# Patient Record
Sex: Female | Born: 1970
Health system: Southern US, Community
[De-identification: ages and names within clinical notes are randomized; demographics above are authoritative.]

## PROBLEM LIST (undated history)

## (undated) DIAGNOSIS — R599 Enlarged lymph nodes, unspecified: Secondary | ICD-10-CM

## (undated) DIAGNOSIS — R112 Nausea with vomiting, unspecified: Secondary | ICD-10-CM

## (undated) DIAGNOSIS — F329 Major depressive disorder, single episode, unspecified: Secondary | ICD-10-CM

## (undated) DIAGNOSIS — F32A Depression, unspecified: Secondary | ICD-10-CM

## (undated) DIAGNOSIS — F411 Generalized anxiety disorder: Secondary | ICD-10-CM

## (undated) DIAGNOSIS — R232 Flushing: Secondary | ICD-10-CM

## (undated) DIAGNOSIS — Z975 Presence of (intrauterine) contraceptive device: Secondary | ICD-10-CM

## (undated) DIAGNOSIS — R4589 Other symptoms and signs involving emotional state: Secondary | ICD-10-CM

## (undated) DIAGNOSIS — Z9889 Other specified postprocedural states: Secondary | ICD-10-CM

## (undated) HISTORY — DX: Other symptoms and signs involving emotional state: R45.89

## (undated) HISTORY — DX: Major depressive disorder, single episode, unspecified: F32.9

## (undated) HISTORY — DX: Enlarged lymph nodes, unspecified: R59.9

## (undated) HISTORY — DX: Depression, unspecified: F32.A

## (undated) HISTORY — DX: Flushing: R23.2

## (undated) HISTORY — PX: ABDOMINOPLASTY: SUR9

## (undated) HISTORY — DX: Generalized anxiety disorder: F41.1

## (undated) HISTORY — DX: Presence of (intrauterine) contraceptive device: Z97.5

---

## 1998-06-12 ENCOUNTER — Emergency Department (HOSPITAL_COMMUNITY): Admission: EM | Admit: 1998-06-12 | Discharge: 1998-06-12 | Payer: Self-pay | Admitting: Emergency Medicine

## 1998-06-18 ENCOUNTER — Ambulatory Visit (HOSPITAL_COMMUNITY): Admission: RE | Admit: 1998-06-18 | Discharge: 1998-06-18 | Payer: Self-pay | Admitting: Family Medicine

## 1998-06-18 ENCOUNTER — Encounter: Payer: Self-pay | Admitting: Family Medicine

## 1998-08-15 ENCOUNTER — Ambulatory Visit (HOSPITAL_COMMUNITY): Admission: RE | Admit: 1998-08-15 | Discharge: 1998-08-15 | Payer: Self-pay | Admitting: Family Medicine

## 1998-08-15 ENCOUNTER — Encounter: Payer: Self-pay | Admitting: Family Medicine

## 1998-11-19 ENCOUNTER — Other Ambulatory Visit: Admission: RE | Admit: 1998-11-19 | Discharge: 1998-11-19 | Payer: Self-pay | Admitting: Obstetrics and Gynecology

## 1999-06-13 ENCOUNTER — Inpatient Hospital Stay (HOSPITAL_COMMUNITY): Admission: AD | Admit: 1999-06-13 | Discharge: 1999-06-14 | Payer: Self-pay | Admitting: *Deleted

## 1999-06-30 ENCOUNTER — Inpatient Hospital Stay (HOSPITAL_COMMUNITY): Admission: AD | Admit: 1999-06-30 | Discharge: 1999-06-30 | Payer: Self-pay | Admitting: Obstetrics and Gynecology

## 1999-07-02 ENCOUNTER — Inpatient Hospital Stay (HOSPITAL_COMMUNITY): Admission: AD | Admit: 1999-07-02 | Discharge: 1999-07-05 | Payer: Self-pay | Admitting: *Deleted

## 1999-07-16 ENCOUNTER — Encounter: Admission: RE | Admit: 1999-07-16 | Discharge: 1999-10-14 | Payer: Self-pay | Admitting: *Deleted

## 2003-03-20 ENCOUNTER — Other Ambulatory Visit: Admission: RE | Admit: 2003-03-20 | Discharge: 2003-03-20 | Payer: Self-pay | Admitting: Internal Medicine

## 2003-09-15 HISTORY — PX: ABDOMINOPLASTY: SUR9

## 2005-03-30 ENCOUNTER — Other Ambulatory Visit: Admission: RE | Admit: 2005-03-30 | Discharge: 2005-03-30 | Payer: Self-pay | Admitting: Family Medicine

## 2006-08-31 ENCOUNTER — Other Ambulatory Visit: Admission: RE | Admit: 2006-08-31 | Discharge: 2006-08-31 | Payer: Self-pay | Admitting: Family Medicine

## 2007-04-25 ENCOUNTER — Emergency Department (HOSPITAL_COMMUNITY): Admission: EM | Admit: 2007-04-25 | Discharge: 2007-04-25 | Payer: Self-pay | Admitting: Emergency Medicine

## 2007-04-28 ENCOUNTER — Emergency Department (HOSPITAL_COMMUNITY): Admission: EM | Admit: 2007-04-28 | Discharge: 2007-04-28 | Payer: Self-pay | Admitting: Emergency Medicine

## 2007-05-01 ENCOUNTER — Emergency Department (HOSPITAL_COMMUNITY): Admission: EM | Admit: 2007-05-01 | Discharge: 2007-05-01 | Payer: Self-pay | Admitting: Emergency Medicine

## 2010-10-22 ENCOUNTER — Other Ambulatory Visit: Payer: Self-pay | Admitting: Obstetrics and Gynecology

## 2010-10-22 DIAGNOSIS — Z1231 Encounter for screening mammogram for malignant neoplasm of breast: Secondary | ICD-10-CM

## 2010-10-22 DIAGNOSIS — Z803 Family history of malignant neoplasm of breast: Secondary | ICD-10-CM

## 2010-10-29 ENCOUNTER — Ambulatory Visit
Admission: RE | Admit: 2010-10-29 | Discharge: 2010-10-29 | Disposition: A | Discharge status: Home / Self Care | Payer: Commercial Managed Care - PPO | Source: Ambulatory Visit | Attending: Obstetrics and Gynecology | Admitting: Obstetrics and Gynecology

## 2010-10-29 DIAGNOSIS — Z1231 Encounter for screening mammogram for malignant neoplasm of breast: Secondary | ICD-10-CM

## 2010-10-29 DIAGNOSIS — Z803 Family history of malignant neoplasm of breast: Secondary | ICD-10-CM

## 2010-11-04 ENCOUNTER — Other Ambulatory Visit: Payer: Self-pay | Admitting: Obstetrics and Gynecology

## 2010-11-04 DIAGNOSIS — R928 Other abnormal and inconclusive findings on diagnostic imaging of breast: Secondary | ICD-10-CM

## 2010-11-12 ENCOUNTER — Ambulatory Visit
Admission: RE | Admit: 2010-11-12 | Discharge: 2010-11-12 | Disposition: A | Discharge status: Home / Self Care | Payer: Commercial Managed Care - PPO | Source: Ambulatory Visit | Attending: Obstetrics and Gynecology | Admitting: Obstetrics and Gynecology

## 2010-11-12 DIAGNOSIS — R928 Other abnormal and inconclusive findings on diagnostic imaging of breast: Secondary | ICD-10-CM

## 2011-06-26 LAB — BASIC METABOLIC PANEL
BUN: 12
Calcium: 9.4
Creatinine, Ser: 0.64
GFR calc Af Amer: >60
GFR calc non Af Amer: >60

## 2011-06-26 LAB — CBC
Platelets: 253
RBC: 3.79 — ABNORMAL LOW
WBC: 8.6

## 2011-06-26 LAB — D-DIMER, QUANTITATIVE: D-Dimer, Quant: 0.37

## 2011-06-29 LAB — COMPREHENSIVE METABOLIC PANEL
ALT: 17
Alkaline Phosphatase: 58
Chloride: 106
Glucose, Bld: 105 — ABNORMAL HIGH
Potassium: 3.7
Sodium: 136
Total Bilirubin: 1
Total Protein: 7.3

## 2011-06-29 LAB — URINALYSIS, ROUTINE W REFLEX MICROSCOPIC
Bilirubin Urine: NEGATIVE
Glucose, UA: NEGATIVE
Ketones, ur: NEGATIVE
Nitrite: NEGATIVE
pH: 6.5

## 2011-06-29 LAB — CBC
Hemoglobin: 12.6
RBC: 4.03
RDW: 11.8
WBC: 6.5

## 2011-06-29 LAB — POCT I-STAT CREATININE
Creatinine, Ser: 0.7
Operator id: 282201

## 2011-06-29 LAB — POCT CARDIAC MARKERS
CKMB, poc: <1 — ABNORMAL LOW
Myoglobin, poc: 30.3

## 2011-06-29 LAB — I-STAT 8, (EC8 V) (CONVERTED LAB)
Acid-base deficit: 2
TCO2: 24
pCO2, Ven: 37.5 — ABNORMAL LOW
pH, Ven: 7.391 — ABNORMAL HIGH

## 2011-06-29 LAB — MONONUCLEOSIS SCREEN: Mono Screen: NEGATIVE

## 2011-11-18 ENCOUNTER — Other Ambulatory Visit: Payer: Self-pay | Admitting: Obstetrics and Gynecology

## 2011-11-18 DIAGNOSIS — N6009 Solitary cyst of unspecified breast: Secondary | ICD-10-CM

## 2011-11-25 ENCOUNTER — Ambulatory Visit
Admission: RE | Admit: 2011-11-25 | Discharge: 2011-11-25 | Disposition: A | Discharge status: Home / Self Care | Payer: 59 | Source: Ambulatory Visit | Attending: Obstetrics and Gynecology | Admitting: Obstetrics and Gynecology

## 2011-11-25 DIAGNOSIS — N6009 Solitary cyst of unspecified breast: Secondary | ICD-10-CM

## 2013-03-14 ENCOUNTER — Encounter: Payer: Self-pay | Admitting: Family Medicine

## 2013-03-14 ENCOUNTER — Ambulatory Visit (INDEPENDENT_AMBULATORY_CARE_PROVIDER_SITE_OTHER): Payer: BC Managed Care – PPO | Admitting: Family Medicine

## 2013-03-14 VITALS — BP 102/58 | HR 76 | Temp 98.7°F | Ht 61.0 in | Wt 159.0 lb

## 2013-03-14 DIAGNOSIS — T148 Other injury of unspecified body region: Secondary | ICD-10-CM

## 2013-03-14 DIAGNOSIS — W57XXXA Bitten or stung by nonvenomous insect and other nonvenomous arthropods, initial encounter: Secondary | ICD-10-CM

## 2013-03-14 MED ORDER — DOXYCYCLINE HYCLATE 100 MG PO CAPS: 100.0000 mg | ORAL_CAPSULE | Freq: Two times a day (BID) | ORAL | Status: DC

## 2013-03-14 NOTE — Progress Notes (Signed)
  Subjective:    Patient ID: Meghan Murray, female    DOB: 1971/08/28, 42 y.o.   MRN: [id number]  HPI Patient presents today with multiple tick bites. Patient states that she's had multiple tick bites over the past 2 months that she has had to remove.  Visit home with multiple dull to cats. Patient states she's had about 1-1-1/2 months of generalized malaise as well as fatigue body aches and chills. Patient reports that her dog was recently diagnosed with Lyme disease and is currently receiving treatment for this. Patient would like to be evaluated for symptoms.    Review of Systems  All other systems reviewed and are negative.       Objective:   Physical Exam  Constitutional: She appears well-developed and well-nourished.  HENT:  Head: Normocephalic and atraumatic.  Right Ear: External ear normal.  Left Ear: External ear normal.  Eyes: Conjunctivae are normal. Pupils are equal, round, and reactive to light.  Neck: Normal range of motion.  Cardiovascular: Normal rate, regular rhythm and normal heart sounds.   Pulmonary/Chest: Effort normal and breath sounds normal.  Abdominal: Soft.  Musculoskeletal: Normal range of motion.  Skin: No rash noted.          Assessment & Plan:  Tick bites - Plan: CBC with Differential, B. burgdorfi antibodies, Rocky mtn spotted fvr abs pnl(IgG+IgM)  Will place patient on extended course of doxycycline pending blood workup. Discussed general, neuro, infectious red flags. Followup as needed.

## 2013-03-15 LAB — CBC WITH DIFFERENTIAL/PLATELET
Basophils Absolute: 0 10*3/uL (ref 0.0–0.1)
Basophils Relative: 0 % (ref 0–1)
Eosinophils Relative: 1 % (ref 0–5)
HCT: 35.5 % — ABNORMAL LOW (ref 36.0–46.0)
MCHC: 34.4 g/dL (ref 30.0–36.0)
MCV: 89.6 fL (ref 78.0–100.0)
Monocytes Absolute: 0.3 10*3/uL (ref 0.1–1.0)
RDW: 12.9 % (ref 11.5–15.5)

## 2013-03-15 LAB — ROCKY MTN SPOTTED FVR ABS PNL(IGG+IGM): RMSF IgM: 0.47 IV

## 2013-03-21 ENCOUNTER — Ambulatory Visit (INDEPENDENT_AMBULATORY_CARE_PROVIDER_SITE_OTHER): Payer: BC Managed Care – PPO | Admitting: Women's Health

## 2013-03-21 ENCOUNTER — Encounter: Payer: Self-pay | Admitting: Women's Health

## 2013-03-21 ENCOUNTER — Other Ambulatory Visit (HOSPITAL_COMMUNITY)
Admission: RE | Admit: 2013-03-21 | Discharge: 2013-03-21 | Disposition: A | Discharge status: Home / Self Care | Payer: BC Managed Care – PPO | Source: Ambulatory Visit | Attending: Obstetrics & Gynecology | Admitting: Obstetrics & Gynecology

## 2013-03-21 VITALS — BP 118/82 | Ht 62.0 in | Wt 157.8 lb

## 2013-03-21 DIAGNOSIS — Z01419 Encounter for gynecological examination (general) (routine) without abnormal findings: Secondary | ICD-10-CM | POA: Insufficient documentation

## 2013-03-21 DIAGNOSIS — F418 Other specified anxiety disorders: Secondary | ICD-10-CM

## 2013-03-21 DIAGNOSIS — R635 Abnormal weight gain: Secondary | ICD-10-CM

## 2013-03-21 DIAGNOSIS — Z1151 Encounter for screening for human papillomavirus (HPV): Secondary | ICD-10-CM | POA: Insufficient documentation

## 2013-03-21 DIAGNOSIS — Z1212 Encounter for screening for malignant neoplasm of rectum: Secondary | ICD-10-CM

## 2013-03-21 LAB — CBC
MCHC: 34.3 g/dL (ref 30.0–36.0)
MCV: 89.8 fL (ref 78.0–100.0)
Platelets: 293 10*3/uL (ref 150–400)
RDW: 13 % (ref 11.5–15.5)
WBC: 5.2 10*3/uL (ref 4.0–10.5)

## 2013-03-21 LAB — COMPREHENSIVE METABOLIC PANEL
ALT: 18 U/L (ref 0–35)
AST: 20 U/L (ref 0–37)
Alkaline Phosphatase: 59 U/L (ref 39–117)
Calcium: 9.6 mg/dL (ref 8.4–10.5)
Chloride: 105 mEq/L (ref 96–112)
Creat: 0.74 mg/dL (ref 0.50–1.10)
Total Bilirubin: 0.6 mg/dL (ref 0.3–1.2)

## 2013-03-21 LAB — HEMOCCULT GUIAC POC 1CARD (OFFICE)

## 2013-03-21 LAB — LIPID PANEL
HDL: 77 mg/dL (ref >39)
LDL Cholesterol: 98 mg/dL (ref 0–99)
Total CHOL/HDL Ratio: 2.4 Ratio
VLDL: 12 mg/dL (ref 0–40)

## 2013-03-21 MED ORDER — VENLAFAXINE HCL ER 75 MG PO CP24: 75.0000 mg | ORAL_CAPSULE | Freq: Every day | ORAL | Status: DC

## 2013-03-21 NOTE — Progress Notes (Signed)
Subjective:     Meghan Murray is a 42 y.o. caucasian female here for a pap smear and physical.  No LMP recorded. Patient is not currently having periods (Reason: IUD).  Current complaints: reports h/o depression and anxiety- currently taking celexa 20mg  daily. States after graduating w/ her master's in dec 2013, her depression has steadily gotten worse.  She is fatigued, used to get up early and run, now she looks forward to sleeping and doesn't want to get up.  Does not find joy in things she used to, tears up as she talks about it. Young daughter is pregnant and moved back in w/ her and she is concerned about having to take on financial responsibility of a new baby in addition to paying student loans. 30+lb weight gain in last 6 months. Feels like she is just not herself. Denies suicidal/homicidal ideations. Also reports daily headaches, worse in AM, goes into ears/neck/shoulders- waxes and wanes throughout the day. Occasional night sweats. Denies vaginal dryness, problems w/ urination or bowel movements.  Had office visit w/ PCP on 7/1 d/t fatigue, tick bites, dog w/ Lyme disease, labs were drawn and normal, was placed on 3wk doxycycline regimen prophylactically.  Has Mirena IUD that was placed May 2009, needs to come out. Would like another Mirena.   Gynecologic History No LMP recorded. Patient is not currently having periods (Reason: IUD). Contraception: IUD Last mammogram: 11/25/11. Results were: Bi-rads 2, no evidence of malignancy. U/S 11/25/11: clustered nonpalpable cyts 8 o'clock 2cm from Rt nipple, no evidence of malignancy. Recommended yearly mammograms. Both her mother and MGM had breast CA dx in 36s.   The following portions of the patient's history were reviewed and updated as appropriate: allergies, current medications, past family history, past medical history, past social history, past surgical history and problem list.  Review of Systems  Review of Systems  Constitutional:  Negative for fever, chills, weight Murray, and diaphoresis. Pos for malaise/fatigue HENT: Negative for hearing Murray, ear pain, nosebleeds, congestion, sore throat, neck pain, tinnitus and ear discharge.   Eyes: Negative for blurred vision, double vision, photophobia, pain, discharge and redness.  Respiratory: Negative for cough, hemoptysis, sputum production, shortness of breath, wheezing and stridor.   Cardiovascular: Negative for chest pain, palpitations, orthopnea, claudication, leg swelling and PND.  Gastrointestinal: negative for abdominal pain. Negative for heartburn, nausea, vomiting, diarrhea, constipation, blood in stool and melena.  Genitourinary: Negative for dysuria, urgency, frequency, hematuria and flank pain.  Musculoskeletal: Negative for myalgias, back pain, joint pain and falls.  Skin: Negative for itching and rash.  Neurological: Negative for dizziness, tingling, tremors, sensory change, speech change, focal weakness, seizures, Murray of consciousness, weakness. Pos for headaches. Endo/Heme/Allergies: Negative for environmental allergies and polydipsia. Does not bruise/bleed easily.  Psychiatric/Behavioral: Negative for suicidal ideas, hallucinations, memory Murray and substance abuse. The patient is not nervous/anxious and does not have insomnia.  Pos for depression.       Objective:    Physical Exam  Vitals reviewed. Constitutional: She is oriented to person, place, and time. She appears well-developed and well-nourished.  HENT:       Head: Normocephalic and atraumatic.             Right Ear: External ear normal.       Left Ear: External ear normal.       Nose: Nose normal.       Mouth/Throat: Oropharynx is clear and moist.       Eyes: Conjunctivae and EOM are normal. Pupils  are equal, round, and reactive to light. Right eye exhibits no discharge. Left eye exhibits no discharge. No scleral icterus.  Neck: Normal range of motion. Neck supple. No tracheal deviation present. No  thyromegaly present.  Cardiovascular: Normal rate, regular rhythm, normal heart sounds and intact distal pulses.  Exam reveals no gallop and no friction rub.   No murmur heard. Respiratory: Effort normal and breath sounds normal. No respiratory distress. She has no wheezes. She has no rales. She exhibits no tenderness.  GI: Soft. Bowel sounds are normal. She exhibits no distension and no mass. There is no tenderness. There is no rebound and no guarding.       Hemoccult neg Genitourinary:       Vulva is normal without lesions      Vagina is pink moist without discharge      Cervix normal in appearance, very anterior, mirena strings visible, and pap is done      Uterus is normal size shape and contour      Adnexa is negative with normal sized ovaries  Musculoskeletal: Normal range of motion. She exhibits no edema and no tenderness.  Neurological: She is alert and oriented to person, place, and time. She has normal reflexes. She displays normal reflexes. No cranial nerve deficit. She exhibits normal muscle tone. Coordination normal.  Skin: Skin is warm and dry. No rash noted. No erythema. No pallor.  Psychiatric: She has a normal mood and affect. Her behavior is normal. Judgment and thought content normal. She does tear up when talking about depression.      Assessment:    Healthy female exam.  Depression/anxiety- not controlled on current regimen Contraception counseling Strong familial h/o Breast CA   Plan:  Stop taking Celexa Rx: Effexor XL 75mg  daily #30 w/ 3RF Massage/chiropractor, OTC meds prn for headaches CBC, CMP, lipid profile, TSH today Mammogram ASAP F/U 4wks for mirena removal and new insertion and f/u depression/med change Call if depression worsens, any suicidal/homicidal ideation  [name], [place] 03/21/2013

## 2013-03-21 NOTE — Patient Instructions (Signed)
Depression, Adult Depression refers to feeling sad, low, down in the dumps, blue, gloomy, or empty. In general, there are two kinds of depression: 1. Depression that we all experience from time to time because of upsetting life experiences, including the loss of a job or the ending of a relationship (normal sadness or normal grief). This kind of depression is considered normal, is short lived, and resolves within a few days to 2 weeks. (Depression experienced after the loss of a loved one is called bereavement. Bereavement often lasts longer than 2 weeks but normally gets better with time.) 2. Clinical depression, which lasts longer than normal sadness or normal grief or interferes with your ability to function at home, at work, and in school. It also interferes with your personal relationships. It affects almost every aspect of your life. Clinical depression is an illness. Symptoms of depression also can be caused by conditions other than normal sadness and grief or clinical depression. Examples of these conditions are listed as follows:  Physical illness Some physical illnesses, including underactive thyroid gland (hypothyroidism), severe anemia, specific types of cancer, diabetes, uncontrolled seizures, heart and lung problems, strokes, and chronic pain are commonly associated with symptoms of depression.  Side effects of some prescription medicine In some people, certain types of prescription medicine can cause symptoms of depression.  Substance abuse Abuse of alcohol and illicit drugs can cause symptoms of depression. SYMPTOMS Symptoms of normal sadness and normal grief include the following:  Feeling sad or crying for short periods of time.  Not caring about anything (apathy).  Difficulty sleeping or sleeping too much.  No longer able to enjoy the things you used to enjoy.  Desire to be by oneself all the time (social isolation).  Lack of energy or motivation.  Difficulty  concentrating or remembering.  Change in appetite or weight.  Restlessness or agitation. Symptoms of clinical depression include the same symptoms of normal sadness or normal grief and also the following symptoms:  Feeling sad or crying all the time.  Feelings of guilt or worthlessness.  Feelings of hopelessness or helplessness.  Thoughts of suicide or the desire to harm yourself (suicidal ideation).  Loss of touch with reality (psychotic symptoms). Seeing or hearing things that are not real (hallucinations) or having false beliefs about your life or the people around you (delusions and paranoia). DIAGNOSIS  The diagnosis of clinical depression usually is based on the severity and duration of the symptoms. Your caregiver also will ask you questions about your medical history and substance use to find out if physical illness, use of prescription medicine, or substance abuse is causing your depression. Your caregiver also may order blood tests. TREATMENT  Typically, normal sadness and normal grief do not require treatment. However, sometimes antidepressant medicine is prescribed for bereavement to ease the depressive symptoms until they resolve. The treatment for clinical depression depends on the severity of your symptoms but typically includes antidepressant medicine, counseling with a mental health professional, or a combination of both. Your caregiver will help to determine what treatment is best for you. Depression caused by physical illness usually goes away with appropriate medical treatment of the illness. If prescription medicine is causing depression, talk with your caregiver about stopping the medicine, decreasing the dose, or substituting another medicine. Depression caused by abuse of alcohol or illicit drugs abuse goes away with abstinence from these substances. Some adults need professional help in order to stop drinking or using drugs. SEEK IMMEDIATE CARE IF:  You have   thoughts  about hurting yourself or others.  You lose touch with reality (have psychotic symptoms).  You are taking medicine for depression and have a serious side effect. FOR MORE INFORMATION National Alliance on Mental Illness: www.nami.[employer]: http://www.maynard.net/ Document Released: 08/28/2000 Document Revised: 03/01/2012 Document Reviewed: 11/30/2011 [facility] Patient Information 2014 [place], [place].  Perimenopause Perimenopause is the time when your body begins to move into the menopause (no menstrual period for 12 straight months). It is a natural process. Perimenopause can begin 2 to 8 years before the menopause and usually lasts for one year after the menopause. During this time, your ovaries may or may not produce an egg. The ovaries vary in their production of estrogen and progesterone hormones each month. This can cause irregular menstrual periods, difficulty in getting pregnant, vaginal bleeding between periods and uncomfortable symptoms. CAUSES  Irregular production of the ovarian hormones, estrogen and progesterone, and not ovulating every month.  Other causes include:  Tumor of the pituitary gland in the brain.  Medical disease that affects the ovaries.  Radiation treatment.  Chemotherapy.  Unknown causes.  Heavy smoking and excessive alcohol intake can bring on perimenopause sooner. SYMPTOMS   Hot flashes.  Night sweats.  Irregular menstrual periods.  Decrease sex drive.  Vaginal dryness.  Headaches.  Mood swings.  Depression.  Memory problems.  Irritability.  Tiredness.  Weight gain.  Trouble getting pregnant.  The beginning of losing bone cells (osteoporosis).  The beginning of hardening of the arteries (atherosclerosis). DIAGNOSIS  Your caregiver will make a diagnosis by analyzing your age, menstrual history and your symptoms. They will do a physical exam noting any changes in your body, especially your female  organs. Female hormone tests may or may not be helpful depending on the amount and when you produce the female hormones. However, other hormone tests may be helpful (ex. thyroid hormone) to rule out other problems. TREATMENT  The decision to treat during the perimenopause should be made by you and your caregiver depending on how the symptoms are affecting you and your life style. There are various treatments available such as:  Treating individual symptoms with a specific medication for that symptom (ex. tranquilizer for depression).  Herbal medications that can help specific symptoms.  Counseling.  Group therapy.  No treatment. HOME CARE INSTRUCTIONS   Before seeing your caregiver, make a list of your menstrual periods (when the occur, how heavy they are, how long between periods and how long they last), your symptoms and when they started.  Take the medication as recommended by your caregiver.  Sleep and rest.  Exercise.  Eat a diet that contains calcium (good for your bones) and soy (acts like estrogen hormone).  Do not smoke.  Avoid alcoholic beverages.  Taking vitamin E may help in certain cases.  Take calcium and vitamin D supplements to help prevent bone loss.  Group therapy is sometimes helpful.  Acupuncture may help in some cases. SEEK MEDICAL CARE IF:   You have any of the above and want to know if it is perimenopause.  You want advice and treatment for any of your symptoms mentioned above.  You need a referral to a specialist (gynecologist, psychiatrist or psychologist). SEEK IMMEDIATE MEDICAL CARE IF:   You have vaginal bleeding.  Your period lasts longer than 8 days.  You periods are recurring sooner than 21 days.  You have bleeding after intercourse.  You have severe depression.  You have pain when you urinate.  You have severe headaches.  You develop vision problems. Document Released: 10/08/2004 Document Revised: 11/23/2011 Document  Reviewed: 06/28/2008 [facility] Patient Information 2014 [place], [place].

## 2013-04-25 ENCOUNTER — Ambulatory Visit: Payer: BC Managed Care – PPO | Admitting: Women's Health

## 2013-06-29 ENCOUNTER — Other Ambulatory Visit: Payer: Self-pay | Admitting: *Deleted

## 2013-06-29 DIAGNOSIS — F418 Other specified anxiety disorders: Secondary | ICD-10-CM

## 2013-06-29 MED ORDER — VENLAFAXINE HCL ER 75 MG PO CP24: 75.0000 mg | ORAL_CAPSULE | Freq: Every day | ORAL | Status: DC

## 2013-07-04 ENCOUNTER — Encounter: Payer: Self-pay | Admitting: Women's Health

## 2013-07-04 ENCOUNTER — Ambulatory Visit (INDEPENDENT_AMBULATORY_CARE_PROVIDER_SITE_OTHER): Payer: 59 | Admitting: Women's Health

## 2013-07-04 VITALS — BP 100/80 | Ht 61.0 in | Wt 163.5 lb

## 2013-07-04 DIAGNOSIS — Z3202 Encounter for pregnancy test, result negative: Secondary | ICD-10-CM

## 2013-07-04 DIAGNOSIS — Z30432 Encounter for removal of intrauterine contraceptive device: Secondary | ICD-10-CM

## 2013-07-04 DIAGNOSIS — Z30430 Encounter for insertion of intrauterine contraceptive device: Secondary | ICD-10-CM

## 2013-07-04 DIAGNOSIS — Z30433 Encounter for removal and reinsertion of intrauterine contraceptive device: Secondary | ICD-10-CM

## 2013-07-04 LAB — POCT URINE PREGNANCY: Preg Test, Ur: NEGATIVE

## 2013-07-04 NOTE — Patient Instructions (Signed)
Nothing in vagina for 3 days (no sex, douching, tampons, etc...) Check your strings once a month to make sure you can feel them If you develop a fever of 100.4 or more in the next few weeks, or if you develop severe abdominal pain, please let [place] know Use a backup method of birth control, such as condoms, for 2 weeks   Intrauterine Device Insertion Care After Refer to this sheet in the next few weeks. These instructions provide you with information on caring for yourself after your procedure. Your caregiver may also give you more specific instructions. Your treatment has been planned according to current medical practices, but problems sometimes occur. Call your caregiver if you have any problems or questions after your procedure. HOME CARE INSTRUCTIONS   Only take over-the-counter or prescription medicines for pain, discomfort, or fever as directed by your caregiver. Do not use aspirin. This may increase bleeding.  Check your IUD to make sure it is in place before you resume sexual activity. You should be able to feel the strings. If you cannot feel the strings, something may be wrong. The IUD may have fallen out of the uterus, or the uterus may have been punctured (perforated) during placement. Also, if the strings are getting longer, it may mean that the IUD is being forced out of the uterus. You no longer have full protection from pregnancy if any of these problems occur.  You may resume sexual intercourse if you are not having problems with the IUD. The IUD is considered immediately effective.  You may resume normal activities.  Keep all follow-up appointments to be sure your IUD has remained in place. After the first exam, yearly exams are advised, unless you cannot feel the strings of your IUD.  Continue to check that the IUD is still in place by feeling for the strings after every menstrual period. SEEK MEDICAL CARE IF:   You have bleeding that is heavier or lasts longer than a normal  menstrual cycle.  You have a fever.  You have increasing cramps or abdominal pain not relieved with medicine.  You have abdominal pain that does not seem to be related to the same area of earlier cramping and pain.  You are lightheaded, unusually weak, or faint.  You have abnormal vaginal discharge or smells.  You have pain during sexual intercourse.  You cannot feel the IUD strings, or the IUD string has gotten longer.  You feel the IUD at the opening of the cervix in the vagina.  You think you are pregnant, or you miss your menstrual period.  The IUD string is hurting your sex partner. Document Released: 04/29/2011 Document Revised: 11/23/2011 Document Reviewed: 04/29/2011 [facility] Patient Information 2014 [place], [place].

## 2013-07-04 NOTE — Progress Notes (Signed)
Patient ID: Meghan Murray, female   DOB: 1971/01/03, 42 y.o.   MRN: [id number] [name] is a 42 y.o. year old G2P2002 Caucasian female who presents for removal of current Mirena that was placed in 2009 and placement of a new Mirena IUD. She is doing well on Effexor, life stressors are easing. No SI/HI.   No LMP recorded. Patient is not currently having periods (Reason: IUD). Pregnancy test today was neg  The risks and benefits of the method and placement have been thouroughly reviewed with the patient and all questions were answered.  Specifically the patient is aware of failure rate of 09/998, expulsion of the IUD and of possible perforation.  The patient is aware of irregular bleeding due to the method and understands the incidence of irregular bleeding diminishes with time.  Signed copy of informed consent in chart.   Time out was performed.  A Pederson speculum was placed in the vagina.  The cervix, which was very anterior, was visualized, mirena strings visible, and were grasped w/ long hemostats and removed w/o difficulty. Cervix was then prepped using Betadine, and grasped with a single tooth tenaculum. The uterus was found to be retroflexed and it sounded to 8 cm.  Mirena IUD placed per manufacturer's recommendations.   The strings were trimmed to 3 cm.  Proper placement of the IUD was verified via transvaginal u/s by myself and JAG.  The patient was given post procedure instructions, including signs and symptoms of infection and to check for the strings after each menses or each month, and refraining from intercourse or anything in the vagina for 3 days.  She was given a Mirena care card with date Mirena placed, and date Mirena to be removed.  She is scheduled for a return appointment in 4 weeks.  [name] 07/04/2013 2:39 PM

## 2013-08-01 ENCOUNTER — Ambulatory Visit: Payer: 59 | Admitting: Women's Health

## 2013-09-12 ENCOUNTER — Encounter: Payer: Self-pay | Admitting: Family Medicine

## 2013-09-12 ENCOUNTER — Ambulatory Visit (INDEPENDENT_AMBULATORY_CARE_PROVIDER_SITE_OTHER): Payer: 59 | Admitting: Family Medicine

## 2013-09-12 ENCOUNTER — Telehealth: Payer: Self-pay | Admitting: Family Medicine

## 2013-09-12 VITALS — BP 111/71 | HR 73 | Temp 100.3°F | Ht 62.0 in | Wt 156.6 lb

## 2013-09-12 DIAGNOSIS — J111 Influenza due to unidentified influenza virus with other respiratory manifestations: Secondary | ICD-10-CM

## 2013-09-12 DIAGNOSIS — J020 Streptococcal pharyngitis: Secondary | ICD-10-CM

## 2013-09-12 DIAGNOSIS — F418 Other specified anxiety disorders: Secondary | ICD-10-CM

## 2013-09-12 DIAGNOSIS — J029 Acute pharyngitis, unspecified: Secondary | ICD-10-CM

## 2013-09-12 DIAGNOSIS — F341 Dysthymic disorder: Secondary | ICD-10-CM

## 2013-09-12 DIAGNOSIS — J101 Influenza due to other identified influenza virus with other respiratory manifestations: Secondary | ICD-10-CM

## 2013-09-12 LAB — POCT RAPID STREP A (OFFICE): Rapid Strep A Screen: POSITIVE — AB

## 2013-09-12 MED ORDER — AMOXICILLIN 500 MG PO CAPS: 500.0000 mg | ORAL_CAPSULE | Freq: Three times a day (TID) | ORAL | Status: DC

## 2013-09-12 MED ORDER — OSELTAMIVIR PHOSPHATE 75 MG PO CAPS: 75.0000 mg | ORAL_CAPSULE | Freq: Two times a day (BID) | ORAL | Status: DC

## 2013-09-12 NOTE — Patient Instructions (Signed)
Strep Throat  Strep throat is an infection of the throat caused by a bacteria named Streptococcus pyogenes. Your caregiver may call the infection streptococcal "tonsillitis" or "pharyngitis" depending on whether there are signs of inflammation in the tonsils or back of the throat. Strep throat is most common in children aged 42 15 years during the cold months of the year, but it can occur in people of any age during any season. This infection is spread from person to person (contagious) through coughing, sneezing, or other close contact.  SYMPTOMS   · Fever or chills.  · Painful, swollen, red tonsils or throat.  · Pain or difficulty when swallowing.  · White or yellow spots on the tonsils or throat.  · Swollen, tender lymph nodes or "glands" of the neck or under the jaw.  · Red rash all over the body (rare).  DIAGNOSIS   Many different infections can cause the same symptoms. A test must be done to confirm the diagnosis so the right treatment can be given. A "rapid strep test" can help your caregiver make the diagnosis in a few minutes. If this test is not available, a light swab of the infected area can be used for a throat culture test. If a throat culture test is done, results are usually available in a day or two.  TREATMENT   Strep throat is treated with antibiotic medicine.  HOME CARE INSTRUCTIONS   · Gargle with 1 tsp of salt in 1 cup of warm water, 3 4 times per day or as needed for comfort.  · Family members who also have a sore throat or fever should be tested for strep throat and treated with antibiotics if they have the strep infection.  · Make sure everyone in your household washes their hands well.  · Do not share food, drinking cups, or personal items that could cause the infection to spread to others.  · You may need to eat a soft food diet until your sore throat gets better.  · Drink enough water and fluids to keep your urine clear or pale yellow. This will help prevent dehydration.  · Get plenty of  rest.  · Stay home from school, daycare, or work until you have been on antibiotics for 24 hours.  · Only take over-the-counter or prescription medicines for pain, discomfort, or fever as directed by your caregiver.  · If antibiotics are prescribed, take them as directed. Finish them even if you start to feel better.  SEEK MEDICAL CARE IF:   · The glands in your neck continue to enlarge.  · You develop a rash, cough, or earache.  · You cough up green, yellow-brown, or bloody sputum.  · You have pain or discomfort not controlled by medicines.  · Your problems seem to be getting worse rather than better.  SEEK IMMEDIATE MEDICAL CARE IF:   · You develop any new symptoms such as vomiting, severe headache, stiff or painful neck, chest pain, shortness of breath, or trouble swallowing.  · You develop severe throat pain, drooling, or changes in your voice.  · You develop swelling of the neck, or the skin on the neck becomes red and tender.  · You have a fever.  · You develop signs of dehydration, such as fatigue, dry mouth, and decreased urination.  · You become increasingly sleepy, or you cannot wake up completely.  Document Released: 08/28/2000 Document Revised: 08/17/2012 Document Reviewed: 10/30/2010  [employer]® Patient Information ©2014 [employer].

## 2013-09-12 NOTE — Telephone Encounter (Signed)
appt at 9 with wong

## 2013-09-12 NOTE — Progress Notes (Signed)
Patient ID: Meghan Murray, female   DOB: [birth year], 42 y.o.   MRN: [id number] SUBJECTIVE: CC: Chief Complaint  Patient presents with  . Acute Visit    BODY ACHES AND CONGESTION   . Sore Throat    HPI: As above, sorethroat for 2 day, started with scratchiness first. Got worse, but the sorethroat isn;t as bad. 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 She feels like the flu with body hurting bad and fever and nose running which is separate than the sorethroat.   Past Medical History  Diagnosis Date  . GAD (generalized anxiety disorder)   . Depression    Past Surgical History  Procedure Laterality Date  . Cesarean section      x2  . Abdominoplasty     History   Social History  . Marital Status: Married    Spouse Name: N/A    Number of Children: N/A  . Years of Education: N/A   Occupational History  . Not on file.   Social History Main Topics  . Smoking status: Never Smoker   . Smokeless tobacco: Never Used  . Alcohol Use: No  . Drug Use: No  . Sexual Activity: Yes    Birth Control/ Protection: IUD   Other Topics Concern  . Not on file   Social History Narrative  . No narrative on file   Family History  Problem Relation Age of Onset  . Cancer Mother     breast  . Hypertension Father    Current Outpatient Prescriptions on File Prior to Visit  Medication Sig Dispense Refill  . levonorgestrel (MIRENA) 20 MCG/24HR IUD 1 each by Intrauterine route once.      . venlafaxine XR (EFFEXOR-XR) 75 MG 24 hr capsule Take 1 capsule (75 mg total) by mouth daily.  30 capsule  3   No current facility-administered medications on file prior to visit.   Allergies  Allergen Reactions  . Flagyl [Metronidazole]   . Sulfa Antibiotics     There is no immunization history on file for this patient. Prior to Admission medications   Medication Sig Start Date End Date Taking? Authorizing Provider  levonorgestrel (MIRENA) 20 MCG/24HR IUD 1 each by Intrauterine route  once.    Historical Provider, MD  venlafaxine XR (EFFEXOR-XR) 75 MG 24 hr capsule Take 1 capsule (75 mg total) by mouth daily. 06/29/13   [name], CNM     ROS: As above in the HPI. All other systems are stable or negative.  OBJECTIVE: APPEARANCE:  Patient in no acute distress.The patient appeared well nourished and normally developed. Acyanotic. Waist: VITAL SIGNS:BP 111/71  Pulse 73  Temp(Src) 100.3 F (37.9 C) (Oral)  Ht 5\' 2"  (1.575 m)  Wt 156 lb 9.6 oz (71.033 kg)  BMI 28.64 kg/m2  Ill looking WF warm to touch. Not septic.  SKIN: warm and  Dry without overt rashes, tattoos and scars  HEAD and Neck: without JVD, Head and scalp: normal Eyes:No scleral icterus. Fundi normal, eye movements normal. Ears: Auricle normal, canal normal, Tympanic membranes normal, insufflation normal. Nose: rhinorrhea Throat: very red Neck & thyroid: normal  CHEST & LUNGS: Chest wall: normal Lungs: Clear  CVS: Reveals the PMI to be normally located. Regular rhythm, First and Second Heart sounds are normal,  absence of murmurs, rubs or gallops. Peripheral vasculature: Radial pulses: normal Dorsal pedis pulses: normal Posterior pulses: normal  ABDOMEN:  Appearance: normal Benign, no organomegaly, no masses, no Abdominal Aortic enlargement.  No Guarding , no rebound. No Bruits. Bowel sounds: normal  RECTAL: N/A GU: N/A  EXTREMETIES: nonedematous.  MUSCULOSKELETAL:  Spine: normal Joints: intact Muscles sore to palpation. C/o bodyaches.   NEUROLOGIC: oriented to time,place and person; nonfocal. Strength is normal Sensory is normal Reflexes are normal Cranial Nerves are normal. Results for orders placed in visit on 09/12/13  POCT RAPID STREP A (OFFICE)      Result Value Range   Rapid Strep A Screen Positive (*) Negative    ASSESSMENT:  Streptococcal sore throat - Plan: amoxicillin (AMOXIL) 500 MG capsule  Sore throat - Plan: Rapid Strep  A  Depression with anxiety  Influenza A - Plan: oseltamivir (TAMIFLU) 75 MG capsule No flu tests available. Patient has flu syndrome symptoms and exposure risk in healthcare for the Flu A that is rampant at present.  PLAN: Hand out on Strep in the AVS  Will treat for both strep and the flu.  Orders Placed This Encounter  Procedures  . Rapid Strep A   Meds ordered this encounter  Medications  . amoxicillin (AMOXIL) 500 MG capsule    Sig: Take 1 capsule (500 mg total) by mouth 3 (three) times daily.    Dispense:  30 capsule    Refill:  0  . oseltamivir (TAMIFLU) 75 MG capsule    Sig: Take 1 capsule (75 mg total) by mouth 2 (two) times daily.    Dispense:  10 capsule    Refill:  0   Medications Discontinued During This Encounter  Medication Reason  . citalopram (CELEXA) 20 MG tablet Change in therapy  note OOW for the rest of the week.  Return if symptoms worsen or fail to improve.  Alexandre Faries P. Modesto Charon, M.D.

## 2013-11-08 ENCOUNTER — Other Ambulatory Visit: Payer: Self-pay | Admitting: *Deleted

## 2013-11-08 DIAGNOSIS — F418 Other specified anxiety disorders: Secondary | ICD-10-CM

## 2013-11-08 MED ORDER — VENLAFAXINE HCL ER 75 MG PO CP24: 75.0000 mg | ORAL_CAPSULE | Freq: Every day | ORAL | Status: DC

## 2013-12-11 ENCOUNTER — Encounter: Payer: Self-pay | Admitting: General Practice

## 2013-12-11 ENCOUNTER — Ambulatory Visit (INDEPENDENT_AMBULATORY_CARE_PROVIDER_SITE_OTHER): Payer: 59 | Admitting: General Practice

## 2013-12-11 ENCOUNTER — Encounter (INDEPENDENT_AMBULATORY_CARE_PROVIDER_SITE_OTHER): Payer: Self-pay

## 2013-12-11 VITALS — BP 101/65 | HR 60 | Temp 97.4°F | Ht 62.0 in | Wt 158.4 lb

## 2013-12-11 DIAGNOSIS — F32A Depression, unspecified: Secondary | ICD-10-CM

## 2013-12-11 DIAGNOSIS — F329 Major depressive disorder, single episode, unspecified: Secondary | ICD-10-CM

## 2013-12-11 DIAGNOSIS — F411 Generalized anxiety disorder: Secondary | ICD-10-CM

## 2013-12-11 DIAGNOSIS — F3289 Other specified depressive episodes: Secondary | ICD-10-CM

## 2013-12-11 MED ORDER — VENLAFAXINE HCL 37.5 MG PO TABS: 37.5000 mg | ORAL_TABLET | Freq: Every day | ORAL | Status: DC

## 2013-12-11 NOTE — Patient Instructions (Signed)

## 2013-12-17 NOTE — Progress Notes (Signed)
   Subjective:    Patient ID: Meghan Murray, female    DOB: October 01, 1970, 43 y.o.   MRN: [id number]  HPI Patient presents today to discuss decreasing effexor. Reports she was taking 75mg  and began on her own cutting them in half. She has been taking 1/2 tablet for past two weeks. Reports mood swings and anxiety are well managed.  Denies thoughts of harming self or others.    Review of Systems  Constitutional: Negative for fever and chills.  Respiratory: Negative for chest tightness and shortness of breath.   Cardiovascular: Negative for chest pain and palpitations.  Psychiatric/Behavioral: Negative for suicidal ideas, behavioral problems and sleep disturbance. The patient is not nervous/anxious.        Objective:   Physical Exam  Constitutional: She appears well-developed and well-nourished.  Cardiovascular: Normal rate, regular rhythm and normal heart sounds.   Pulmonary/Chest: Effort normal and breath sounds normal. No respiratory distress. She exhibits no tenderness.          Assessment & Plan:  1. Depression  - venlafaxine (EFFEXOR) 37.5 MG tablet; Take 1 tablet (37.5 mg total) by mouth daily.  Dispense: 30 tablet; Refill: 3  2. Generalized anxiety disorder -RTO in 2 weeks  -Patient verbalized understanding Erby Pian, FNP-C

## 2013-12-27 ENCOUNTER — Ambulatory Visit (INDEPENDENT_AMBULATORY_CARE_PROVIDER_SITE_OTHER): Payer: 59 | Admitting: General Practice

## 2013-12-27 ENCOUNTER — Encounter: Payer: Self-pay | Admitting: General Practice

## 2013-12-27 VITALS — BP 104/65 | HR 64 | Temp 99.0°F | Ht 62.0 in | Wt 156.2 lb

## 2013-12-27 DIAGNOSIS — F418 Other specified anxiety disorders: Secondary | ICD-10-CM

## 2013-12-27 DIAGNOSIS — F341 Dysthymic disorder: Secondary | ICD-10-CM

## 2013-12-27 MED ORDER — VENLAFAXINE HCL 37.5 MG PO TABS: 37.5000 mg | ORAL_TABLET | Freq: Every day | ORAL | Status: DC

## 2013-12-27 NOTE — Patient Instructions (Signed)
Exercise to Stay Healthy Exercise helps you become and stay healthy. EXERCISE IDEAS AND TIPS Choose exercises that:  You enjoy.  Fit into your day. You do not need to exercise really hard to be healthy. You can do exercises at a slow or medium level and stay healthy. You can:  Stretch before and after working out.  Try yoga, Pilates, or tai chi.  Lift weights.  Walk fast, swim, jog, run, climb stairs, bicycle, dance, or rollerskate.  Take aerobic classes. Exercises that burn about 150 calories:  Running 1  miles in 15 minutes.  Playing volleyball for 45 to 60 minutes.  Washing and waxing a car for 45 to 60 minutes.  Playing touch football for 45 minutes.  Walking 1  miles in 35 minutes.  Pushing a stroller 1  miles in 30 minutes.  Playing basketball for 30 minutes.  Raking leaves for 30 minutes.  Bicycling 5 miles in 30 minutes.  Walking 2 miles in 30 minutes.  Dancing for 30 minutes.  Shoveling snow for 15 minutes.  Swimming laps for 20 minutes.  Walking up stairs for 15 minutes.  Bicycling 4 miles in 15 minutes.  Gardening for 30 to 45 minutes.  Jumping rope for 15 minutes.  Washing windows or floors for 45 to 60 minutes. Document Released: 10/03/2010 Document Revised: 11/23/2011 Document Reviewed: 10/03/2010 [employer] Patient Information 2014 [employer].  

## 2013-12-29 NOTE — Progress Notes (Signed)
   Subjective:    Patient ID: Meghan Murray, female    DOB: July 15, 1971, 43 y.o.   MRN: [id number]  HPI Patient presents today for follow up of medication adjustment for depression and anxiety. She reports this dose is effective in managing mood swings and anxiety. Denies wanting to further decrease medication at this point. Taking medication as prescribed.     Review of Systems  Constitutional: Negative for fever and chills.  Respiratory: Negative for chest tightness and shortness of breath.   Cardiovascular: Negative for chest pain and palpitations.  Psychiatric/Behavioral: Negative for suicidal ideas, sleep disturbance and self-injury. The patient is not nervous/anxious.        Objective:   Physical Exam  Constitutional: She is oriented to person, place, and time. She appears well-developed and well-nourished.  Cardiovascular: Normal rate, regular rhythm and normal heart sounds.   Pulmonary/Chest: Effort normal and breath sounds normal. No respiratory distress. She exhibits no tenderness.  Neurological: She is alert and oriented to person, place, and time.  Skin: Skin is warm and dry.  Psychiatric: She has a normal mood and affect.          Assessment & Plan:  1. Depression with anxiety - venlafaxine (EFFEXOR) 37.5 MG tablet; Take 1 tablet (37.5 mg total) by mouth daily.  Dispense: 30 tablet; Refill: 5 -discussed relaxation techniques -RTO prn and as scheduled -Patient verbalized understanding Erby Pian, FNP-C

## 2014-03-07 ENCOUNTER — Other Ambulatory Visit: Payer: Self-pay

## 2014-03-07 DIAGNOSIS — Z1231 Encounter for screening mammogram for malignant neoplasm of breast: Secondary | ICD-10-CM

## 2014-03-21 ENCOUNTER — Ambulatory Visit
Admission: RE | Admit: 2014-03-21 | Discharge: 2014-03-21 | Disposition: A | Discharge status: Home / Self Care | Payer: 59 | Source: Ambulatory Visit

## 2014-03-21 DIAGNOSIS — Z1231 Encounter for screening mammogram for malignant neoplasm of breast: Secondary | ICD-10-CM

## 2014-06-28 ENCOUNTER — Ambulatory Visit (INDEPENDENT_AMBULATORY_CARE_PROVIDER_SITE_OTHER): Payer: 59 | Admitting: Family Medicine

## 2014-06-28 VITALS — BP 106/70 | HR 67 | Temp 97.9°F | Ht 61.0 in | Wt 155.6 lb

## 2014-06-28 DIAGNOSIS — L309 Dermatitis, unspecified: Secondary | ICD-10-CM

## 2014-06-28 MED ORDER — METHYLPREDNISOLONE ACETATE 80 MG/ML IJ SUSP
80.0000 mg | Freq: Once | INTRAMUSCULAR | Status: AC
  Administered 2014-06-28: 80 mg via INTRAMUSCULAR

## 2014-06-28 MED ORDER — DOXYCYCLINE HYCLATE 100 MG PO CAPS: 100.0000 mg | ORAL_CAPSULE | Freq: Two times a day (BID) | ORAL | Status: DC

## 2014-06-28 MED ORDER — METHYLPREDNISOLONE (PAK) 4 MG PO TABS: ORAL_TABLET | ORAL | Status: DC

## 2014-06-28 NOTE — Progress Notes (Signed)
   Subjective:    Patient ID: Meghan Murray, female    DOB: May 13, 1971, 43 y.o.   MRN: [id number]  HPI  C/o rash on her right neck and dermatitis around her mouth.  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  Review of Systems C/o dermatitis No chest pain, SOB, HA, dizziness, vision change, N/V, diarrhea, constipation, dysuria, urinary urgency or frequency, myalgias, arthralgias or rash.     Objective:   Physical Exam   Vital signs noted  Well developed well nourished female.  HEENT - Head atraumatic Normocephalic                Eyes - PERRLA, Conjuctiva - clear Sclera- Clear EOMI                Ears - EAC's Wnl TM's Wnl Gross Hearing WNL                Throat - oropharanx wnl Respiratory - Lungs CTA bilateral Cardiac - RRR S1 and S2 without murmur GI - Abdomen soft Nontender and bowel sounds active x 4 Extremities - No edema. Neuro - Grossly intact. Skin - peri oral rash and rash over left neck    Assessment & Plan:  Dermatitis - Plan: methylPREDNISolone acetate (DEPO-MEDROL) injection 80 mg, methylPREDNIsolone (MEDROL DOSPACK) 4 MG tablet, doxycycline (VIBRAMYCIN) 100 MG capsule  Lysbeth Penner FNP

## 2014-07-16 ENCOUNTER — Encounter: Payer: Self-pay | Admitting: General Practice

## 2014-08-20 ENCOUNTER — Encounter (HOSPITAL_COMMUNITY): Payer: Self-pay

## 2014-08-20 ENCOUNTER — Emergency Department (HOSPITAL_COMMUNITY)
Admission: EM | Admit: 2014-08-20 | Discharge: 2014-08-20 | Disposition: A | Discharge status: Home / Self Care | Payer: 59 | Source: Home / Self Care

## 2014-08-20 DIAGNOSIS — R102 Pelvic and perineal pain: Secondary | ICD-10-CM

## 2014-08-20 LAB — POCT URINALYSIS DIP (DEVICE)
BILIRUBIN URINE: NEGATIVE
GLUCOSE, UA: NEGATIVE mg/dL
Hgb urine dipstick: NEGATIVE
Ketones, ur: NEGATIVE mg/dL
Leukocytes, UA: NEGATIVE
Nitrite: NEGATIVE
Protein, ur: NEGATIVE mg/dL
Urobilinogen, UA: 0.2 mg/dL (ref 0.0–1.0)
pH: 6 (ref 5.0–8.0)

## 2014-08-20 NOTE — Discharge Instructions (Signed)
Drink plenty of fluids,see your gyn if further problems.

## 2014-08-20 NOTE — ED Notes (Signed)
Discussed normal UA report. Advised to monitor symptoms and to be rechecked if condition worsens

## 2014-08-20 NOTE — ED Notes (Signed)
Long history of UTI , pyelonephritis. Feels as if she has another UTI

## 2014-08-20 NOTE — ED Provider Notes (Signed)
CSN: [id number]     Arrival date & time 08/20/14  1742 History   None    Chief Complaint  Patient presents with  . Urinary Tract Infection   (Consider location/radiation/quality/duration/timing/severity/associated sxs/prior Treatment) Patient is a 43 y.o. female presenting with urinary tract infection. The history is provided by the patient.  Urinary Tract Infection This is a chronic problem. The current episode started 12 to 24 hours ago. The problem has not changed since onset.Associated symptoms include abdominal pain. Pertinent negatives include no chest pain.    Past Medical History  Diagnosis Date  . GAD (generalized anxiety disorder)   . Depression    Past Surgical History  Procedure Laterality Date  . Cesarean section      x2  . Abdominoplasty     Family History  Problem Relation Age of Onset  . Cancer Mother     breast  . Hypertension Father    History  Substance Use Topics  . Smoking status: Never Smoker   . Smokeless tobacco: Never Used  . Alcohol Use: No   OB History    Gravida Para Term Preterm AB TAB SAB Ectopic Multiple Living   2 2 2       2      Review of Systems  Constitutional: Negative.   Cardiovascular: Negative for chest pain.  Gastrointestinal: Positive for abdominal pain.  Genitourinary: Positive for pelvic pain. Negative for dysuria, urgency, frequency, vaginal bleeding and vaginal discharge.    Allergies  Flagyl and Sulfa antibiotics  Home Medications   Prior to Admission medications   Medication Sig Start Date End Date Taking? Authorizing Provider  levonorgestrel (MIRENA) 20 MCG/24HR IUD 1 each by Intrauterine route once.   Yes Historical Provider, MD  venlafaxine (EFFEXOR) 37.5 MG tablet Take 1 tablet (37.5 mg total) by mouth daily. 12/27/13  Yes Mae Loree Fee, FNP  doxycycline (VIBRAMYCIN) 100 MG capsule Take 1 capsule (100 mg total) by mouth 2 (two) times daily. 06/28/14   Lysbeth Penner, FNP  methylPREDNIsolone (MEDROL  DOSPACK) 4 MG tablet follow package directions 06/28/14   Lysbeth Penner, FNP   BP 103/64 mmHg  Pulse 60  Temp(Src) 98.5 F (36.9 C) (Oral)  Resp 14  SpO2 100% Physical Exam  Constitutional: She is oriented to person, place, and time. She appears well-developed and well-nourished. No distress.  Abdominal: Soft. Bowel sounds are normal. She exhibits no distension and no mass. There is no tenderness. There is no rebound and no guarding.  Pelvic pressure and low back pressure, no cva pain   Neurological: She is alert and oriented to person, place, and time.  Skin: Skin is warm and dry.  Nursing note and vitals reviewed.   ED Course  Procedures (including critical care time) Labs Review Labs Reviewed  POCT URINALYSIS DIP (DEVICE)    Imaging Review No results found.   MDM   1. Pelvic pain in female        Billy Fischer, MD 08/20/14 [phone]

## 2014-09-29 ENCOUNTER — Other Ambulatory Visit: Payer: Self-pay | Admitting: General Practice

## 2014-10-09 ENCOUNTER — Telehealth: Payer: Self-pay | Admitting: Family Medicine

## 2014-10-09 MED ORDER — VENLAFAXINE HCL 37.5 MG PO TABS: ORAL_TABLET | ORAL | Status: DC

## 2014-10-09 NOTE — Telephone Encounter (Signed)
Has appt with you 10/25/14

## 2014-10-09 NOTE — Telephone Encounter (Signed)
Rx sent to pharmacy per pt's request.  

## 2014-10-25 ENCOUNTER — Ambulatory Visit: Payer: 59 | Admitting: Family

## 2014-11-08 ENCOUNTER — Ambulatory Visit (INDEPENDENT_AMBULATORY_CARE_PROVIDER_SITE_OTHER): Payer: 59 | Admitting: Family Medicine

## 2014-11-08 ENCOUNTER — Encounter: Payer: Self-pay | Admitting: Family Medicine

## 2014-11-08 VITALS — BP 98/66 | HR 64 | Temp 97.7°F | Ht 61.0 in | Wt 157.4 lb

## 2014-11-08 DIAGNOSIS — F329 Major depressive disorder, single episode, unspecified: Secondary | ICD-10-CM | POA: Diagnosis not present

## 2014-11-08 DIAGNOSIS — F32A Depression, unspecified: Secondary | ICD-10-CM

## 2014-11-08 MED ORDER — VENLAFAXINE HCL 37.5 MG PO TABS: 75.0000 mg | ORAL_TABLET | Freq: Every day | ORAL | Status: DC

## 2014-11-08 NOTE — Progress Notes (Signed)
Subjective:  Patient ID: Meghan Murray, female    DOB: 1970-10-11  Age: 44 y.o. MRN: [id number]  CC: Depression   HPI Talyah Seder Federici presents for ongoing treatment of depression which is stable and the patient's assessment today. She denies being withdrawn said tearful. No trouble making decision. 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 That in turn has been adequately treated with venlafaxine. In fact she is taking 75 mg now and thinks that once the warm weather hits she may be able to taper back to 37-1/2 and or discontinue the medication completely. She denies any side effects on going from use of the medication.  History Bo has a past medical history of GAD (generalized anxiety disorder) and Depression.   She has past surgical history that includes Cesarean section and Abdominoplasty.   Her family history includes Cancer in her mother; Hypertension in her father.She reports that she has never smoked. She has never used smokeless tobacco. She reports that she does not drink alcohol or use illicit drugs.  Current Outpatient Prescriptions on File Prior to Visit  Medication Sig Dispense Refill  . levonorgestrel (MIRENA) 20 MCG/24HR IUD 1 each by Intrauterine route once.     No current facility-administered medications on file prior to visit.    ROS Review of Systems  Constitutional: Negative for fever, chills, diaphoresis, appetite change, fatigue and unexpected weight change.  HENT: Negative for congestion, ear pain, hearing loss, postnasal drip, rhinorrhea, sneezing, sore throat and trouble swallowing.   Eyes: Negative for pain.  Respiratory: Negative for cough, chest tightness and shortness of breath.   Cardiovascular: Negative for chest pain and palpitations.  Gastrointestinal: Negative for nausea, vomiting, abdominal pain, diarrhea and constipation.    Genitourinary: Negative for dysuria, frequency and menstrual problem.  Musculoskeletal: Negative for joint swelling and arthralgias.  Skin: Negative for rash.  Neurological: Negative for dizziness, weakness, numbness and headaches.  Psychiatric/Behavioral: Negative for dysphoric mood and agitation.    Objective:  BP 98/66 mmHg  Pulse 64  Temp(Src) 97.7 F (36.5 C) (Oral)  Ht 5\' 1"  (1.549 m)  Wt 157 lb 6.4 oz (71.396 kg)  BMI 29.76 kg/m2  BP Readings from Last 3 Encounters:  11/08/14 98/66  08/20/14 103/64  06/28/14 106/70    Wt Readings from Last 3 Encounters:  11/08/14 157 lb 6.4 oz (71.396 kg)  06/28/14 155 lb 9.6 oz (70.58 kg)  12/27/13 156 lb 3.2 oz (70.852 kg)     Physical Exam  Constitutional: She is oriented to person, place, and time. She appears well-developed and well-nourished. No distress.  HENT:  Head: Normocephalic and atraumatic.  Right Ear: External ear normal.  Left Ear: External ear normal.  Nose: Nose normal.  Mouth/Throat: Oropharynx is clear and moist.  Eyes: Conjunctivae and EOM are normal. Pupils are equal, round, and reactive to light.  Neck: Normal range of motion. Neck supple. No thyromegaly present.  Cardiovascular: Normal rate, regular rhythm and normal heart sounds.   No murmur heard. Pulmonary/Chest: Effort normal and breath sounds normal. No respiratory distress. She has no wheezes. She has no rales.  Abdominal: Soft. Bowel sounds are normal. She exhibits no distension. There is no tenderness.  Lymphadenopathy:    She has no cervical adenopathy.  Neurological: She is alert and oriented to person, place, and time. She has normal reflexes.  Skin:  Skin is warm and dry.  Psychiatric: She has a normal mood and affect. Her behavior is normal. Judgment and thought content normal.    No results found for: HGBA1C  Lab Results  Component Value Date   WBC 5.2 03/21/2013   HGB 12.4 03/21/2013   HCT 36.1 03/21/2013   PLT 293 03/21/2013    GLUCOSE 91 03/21/2013   CHOL 187 03/21/2013   TRIG 59 03/21/2013   HDL 77 03/21/2013   LDLCALC 98 03/21/2013   ALT 18 03/21/2013   AST 20 03/21/2013   NA 139 03/21/2013   K 4.1 03/21/2013   CL 105 03/21/2013   CREATININE 0.74 03/21/2013   BUN 15 03/21/2013   CO2 27 03/21/2013   TSH 0.922 03/21/2013    No results found.  Assessment & Plan:   Concetta was seen today for depression.  Diagnoses and all orders for this visit:  Depression  Other orders -     venlafaxine (EFFEXOR) 37.5 MG tablet; Take 2 tablets (75 mg total) by mouth daily. TAKE ONE (1) TABLET EACH DAY   I have discontinued Ms. Marner's methylPREDNIsolone and doxycycline. I have also changed her venlafaxine. Additionally, I am having her maintain her levonorgestrel.  Meds ordered this encounter  Medications  . venlafaxine (EFFEXOR) 37.5 MG tablet    Sig: Take 2 tablets (75 mg total) by mouth daily. TAKE ONE (1) TABLET EACH DAY    Dispense:  180 tablet    Refill:  1    Comments: I okayed her to self titrate based on symptoms with regard to tapering to 37-1/2 or off the medicine. I would like to hear from her if she has to go above 75 mg due to any change for the worse in her symptoms.  Follow-up: Return in about 6 months (around 05/09/2015) for CPE, Depression.  Claretta Fraise, M.D.

## 2014-11-15 ENCOUNTER — Emergency Department (HOSPITAL_COMMUNITY)
Admission: EM | Admit: 2014-11-15 | Discharge: 2014-11-15 | Disposition: A | Discharge status: Home / Self Care | Payer: 59 | Source: Home / Self Care | Attending: Family Medicine | Admitting: Family Medicine

## 2014-11-15 ENCOUNTER — Ambulatory Visit: Payer: 59 | Admitting: Nurse Practitioner

## 2014-11-15 ENCOUNTER — Encounter (HOSPITAL_COMMUNITY): Payer: Self-pay | Admitting: *Deleted

## 2014-11-15 DIAGNOSIS — J029 Acute pharyngitis, unspecified: Secondary | ICD-10-CM

## 2014-11-15 LAB — POCT RAPID STREP A: STREPTOCOCCUS, GROUP A SCREEN (DIRECT): NEGATIVE

## 2014-11-15 NOTE — ED Notes (Signed)
Pt  Reports  Symptoms  Of  sorethroat  With  Bilateral ear  Pain      X  6  Days      -  Also  Has  Some   Congestion            Pt  Reports  Symptoms  Not  releived  By OTC    meds

## 2014-11-15 NOTE — ED Provider Notes (Signed)
CSN: [id number]     Arrival date & time 11/15/14  1049 History   First MD Initiated Contact with Patient 11/15/14 1131     Chief Complaint  Patient presents with  . 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 Lives in patient's household PCP: [facility]  Patient is a 44 y.o. female presenting with pharyngitis. The history is provided by the patient.  Sore Throat This is a new problem. Episode onset: sx began 6 days ago. The problem occurs constantly. The problem has been gradually improving. Associated symptoms comments: Nasal congestion, bilateral ear pressure, headache.    Past Medical History  Diagnosis Date  . GAD (generalized anxiety disorder)   . Depression    Past Surgical History  Procedure Laterality Date  . Cesarean section      x2  . Abdominoplasty     Family History  Problem Relation Age of Onset  . Cancer Mother     breast  . Hypertension Father    History  Substance Use Topics  . Smoking status: Never Smoker   . Smokeless tobacco: Never Used  . Alcohol Use: No   OB History    Gravida Para Term Preterm AB TAB SAB Ectopic Multiple Living   2 2 2       2      Review of Systems  All other systems reviewed and are negative.   Allergies  Flagyl and Sulfa antibiotics  Home Medications   Prior to Admission medications   Medication Sig Start Date End Date Taking? Authorizing Provider  levonorgestrel (MIRENA) 20 MCG/24HR IUD 1 each by Intrauterine route once.    Historical Provider, MD  venlafaxine (EFFEXOR) 37.5 MG tablet Take 2 tablets (75 mg total) by mouth daily. TAKE ONE (1) TABLET EACH DAY 11/08/14   Claretta Fraise, MD   BP 103/73 mmHg  Pulse 73  Temp(Src) 98.5 F (36.9 C) (Oral)  Resp 18  SpO2 97% Physical Exam  Constitutional: She is oriented to person, place, and time. She appears well-developed and  well-nourished. No distress.  HENT:  Head: Normocephalic and atraumatic.  Right Ear: Hearing, tympanic membrane, external ear and ear canal normal.  Left Ear: Hearing, tympanic membrane, external ear and ear canal normal.  Nose: Nose normal.  Mouth/Throat: Uvula is midline, oropharynx is clear and moist and mucous membranes are normal. No oral lesions. No trismus in the jaw. No uvula swelling.  Eyes: Conjunctivae are normal. No scleral icterus.  Neck: Normal range of motion. Neck supple.  Cardiovascular: Normal rate, regular rhythm and normal heart sounds.   Pulmonary/Chest: Effort normal and breath sounds normal. No respiratory distress. She has no wheezes.  Lymphadenopathy:    She has no cervical adenopathy.  Neurological: She is alert and oriented to person, place, and time.  Skin: Skin is warm and dry.  Psychiatric: She has a normal mood and affect. Her behavior is normal.  Nursing note and vitals reviewed.   ED Course  Procedures (including critical care time) Labs Review Labs Reviewed  POCT RAPID STREP A ([facility] CARE ONLY)    Imaging Review No results found.   MDM   1. Sore throat    Rapid strep negative Will hold swab for 3 day culture and advise if results indicate the need for treatment. Warm salt water gargles, tylenol and ibuprofen. Expect improvement over the next few days.  If no improvement, please follow up with [facility]  Lutricia Feil, [place] 11/15/14 [phone]

## 2014-11-15 NOTE — Discharge Instructions (Signed)
Rapid strep negative Will hold swab for 3 day culture and advise if results indicate the need for treatment. Warm salt water gargles, tylenol and ibuprofen. Expect improvement over the next few days.  If no improvement, please follow up with [facility] Salt Water Gargle This solution will help make your mouth and throat feel better. HOME CARE INSTRUCTIONS   Mix 1 teaspoon of salt in 8 ounces of warm water.  Gargle with this solution as much or often as you need or as directed. Swish and gargle gently if you have any sores or wounds in your mouth.  Do not swallow this mixture. Document Released: 06/04/2004 Document Revised: 11/23/2011 Document Reviewed: 10/26/2008 [facility] Patient Information 2015 [place], [place]. This information is not intended to replace advice given to you by your health care provider. Make sure you discuss any questions you have with your health care provider.  Sore Throat A sore throat is pain, burning, irritation, or scratchiness of the throat. There is often pain or tenderness when swallowing or talking. A sore throat may be accompanied by other symptoms, such as coughing, sneezing, fever, and swollen neck glands. A sore throat is often the first sign of another sickness, such as a cold, flu, strep throat, or mononucleosis (commonly known as mono). Most sore throats go away without medical treatment. CAUSES  The most common causes of a sore throat include:  A viral infection, such as a cold, flu, or mono.  A bacterial infection, such as strep throat, tonsillitis, or whooping cough.  Seasonal allergies.  Dryness in the air.  Irritants, such as smoke or pollution.  Gastroesophageal reflux disease (GERD). HOME CARE INSTRUCTIONS   Only take over-the-counter medicines as directed by your caregiver.  Drink enough fluids to keep your urine clear or pale yellow.  Rest as needed.  Try using throat sprays, lozenges, or sucking on hard candy to ease any pain (if older  than 4 years or as directed).  Sip warm liquids, such as broth, herbal tea, or warm water with honey to relieve pain temporarily. You may also eat or drink cold or frozen liquids such as frozen ice pops.  Gargle with salt water (mix 1 tsp salt with 8 oz of water).  Do not smoke and avoid secondhand smoke.  Put a cool-mist humidifier in your bedroom at night to moisten the air. You can also turn on a hot shower and sit in the bathroom with the door closed for 5-10 minutes. SEEK IMMEDIATE MEDICAL CARE IF:  You have difficulty breathing.  You are unable to swallow fluids, soft foods, or your saliva.  You have increased swelling in the throat.  Your sore throat does not get better in 7 days.  You have nausea and vomiting.  You have a fever or persistent symptoms for more than 2-3 days.  You have a fever and your symptoms suddenly get worse. MAKE SURE YOU:   Understand these instructions.  Will watch your condition.  Will get help right away if you are not doing well or get worse. Document Released: 10/08/2004 Document Revised: 08/17/2012 Document Reviewed: 05/08/2012 [facility] Patient Information 2015 [place], [place]. This information is not intended to replace advice given to you by your health care provider. Make sure you discuss any questions you have with your health care provider.

## 2014-11-18 LAB — CULTURE, GROUP A STREP: STREP A CULTURE: NEGATIVE

## 2014-11-20 ENCOUNTER — Ambulatory Visit (INDEPENDENT_AMBULATORY_CARE_PROVIDER_SITE_OTHER): Payer: 59 | Admitting: Nurse Practitioner

## 2014-11-20 ENCOUNTER — Encounter: Payer: Self-pay | Admitting: Nurse Practitioner

## 2014-11-20 VITALS — BP 110/70 | HR 63 | Temp 98.2°F | Ht 61.0 in | Wt 155.6 lb

## 2014-11-20 DIAGNOSIS — J0100 Acute maxillary sinusitis, unspecified: Secondary | ICD-10-CM | POA: Diagnosis not present

## 2014-11-20 MED ORDER — AMOXICILLIN 875 MG PO TABS: 875.0000 mg | ORAL_TABLET | Freq: Two times a day (BID) | ORAL | Status: DC

## 2014-11-20 NOTE — Patient Instructions (Signed)

## 2014-11-20 NOTE — Progress Notes (Signed)
  Subjective:     Meghan Murray is a 44 y.o. female who presents for evaluation of sinus pain. Symptoms include: congestion, cough, facial pain, nasal congestion, post nasal drip, sinus pressure and sore throat. Onset of symptoms was 10 days ago. Symptoms have been gradually worsening since that time. Past history is significant for no history of pneumonia or bronchitis. Patient is a non-smoker.  The following portions of the patient's history were reviewed and updated as appropriate: allergies, current medications, past family history, past medical history, past social history, past surgical history and problem list.  Review of Systems Pertinent items are noted in HPI.   Objective:    BP 110/70 mmHg  Pulse 63  Temp(Src) 98.2 F (36.8 C) (Oral)  Ht 5\' 1"  (1.549 m)  Wt 155 lb 9.6 oz (70.58 kg)  BMI 29.42 kg/m2 General appearance: alert and cooperative Eyes: conjunctivae/corneas clear. PERRL, EOM's intact. Fundi benign. Ears: normal TM's and external ear canals both ears Nose: Nares normal. Septum midline. Mucosa normal. No drainage or sinus tenderness., green discharge, moderate congestion, turbinates red Throat: lips, mucosa, and tongue normal; teeth and gums normal Neck: no adenopathy, no carotid bruit, no JVD, supple, symmetrical, trachea midline and thyroid not enlarged, symmetric, no tenderness/mass/nodules Lungs: clear to auscultation bilaterally Heart: regular rate and rhythm, S1, S2 normal, no murmur, click, rub or gallop    Assessment:    Acute bacterial sinusitis.    Plan:   Meds ordered this encounter  Medications  . amoxicillin (AMOXIL) 875 MG tablet    Sig: Take 1 tablet (875 mg total) by mouth 2 (two) times daily. 1 po BID    Dispense:  20 tablet    Refill:  0    Order Specific Question:  Supervising Provider    Answer:  Chipper Herb [[id number]]   1. Take meds as prescribed 2. Use a cool mist humidifier especially during the winter months and when heat has  been humid. 3. Use saline nose sprays frequently 4. Saline irrigations of the nose can be very helpful if done frequently.  * 4X daily for 1 week*  * Use of a nettie pot can be helpful with this. Follow directions with this* 5. Drink plenty of fluids 6. Keep thermostat turn down low 7.For any cough or congestion  Use plain Mucinex- regular strength or max strength is fine   * Children- consult with Pharmacist for dosing 8. For fever or aces or pains- take tylenol or ibuprofen appropriate for age and weight.  * for fevers greater than 101 orally you may alternate ibuprofen and tylenol every  3 hours.   Mary-Margaret Hassell Done, FNP

## 2015-07-09 ENCOUNTER — Other Ambulatory Visit: Payer: Self-pay

## 2015-07-09 DIAGNOSIS — Z1231 Encounter for screening mammogram for malignant neoplasm of breast: Secondary | ICD-10-CM

## 2015-08-14 ENCOUNTER — Ambulatory Visit
Admission: RE | Admit: 2015-08-14 | Discharge: 2015-08-14 | Disposition: A | Discharge status: Home / Self Care | Payer: 59 | Source: Ambulatory Visit

## 2015-08-14 DIAGNOSIS — Z1231 Encounter for screening mammogram for malignant neoplasm of breast: Secondary | ICD-10-CM

## 2015-10-30 ENCOUNTER — Encounter: Payer: Self-pay | Admitting: Adult Health

## 2015-10-30 ENCOUNTER — Ambulatory Visit (INDEPENDENT_AMBULATORY_CARE_PROVIDER_SITE_OTHER): Payer: 59 | Admitting: Adult Health

## 2015-10-30 VITALS — BP 120/72 | HR 72 | Ht 61.0 in | Wt 158.0 lb

## 2015-10-30 DIAGNOSIS — F489 Nonpsychotic mental disorder, unspecified: Secondary | ICD-10-CM

## 2015-10-30 DIAGNOSIS — F329 Major depressive disorder, single episode, unspecified: Secondary | ICD-10-CM | POA: Diagnosis not present

## 2015-10-30 DIAGNOSIS — F32A Depression, unspecified: Secondary | ICD-10-CM | POA: Insufficient documentation

## 2015-10-30 DIAGNOSIS — R4589 Other symptoms and signs involving emotional state: Secondary | ICD-10-CM | POA: Insufficient documentation

## 2015-10-30 DIAGNOSIS — N951 Menopausal and female climacteric states: Secondary | ICD-10-CM

## 2015-10-30 DIAGNOSIS — R232 Flushing: Secondary | ICD-10-CM

## 2015-10-30 HISTORY — DX: Other symptoms and signs involving emotional state: R45.89

## 2015-10-30 HISTORY — DX: Depression, unspecified: F32.A

## 2015-10-30 HISTORY — DX: Flushing: R23.2

## 2015-10-30 MED ORDER — ESCITALOPRAM OXALATE 10 MG PO TABS: 10.0000 mg | ORAL_TABLET | Freq: Every day | ORAL | Status: DC

## 2015-10-30 NOTE — Progress Notes (Signed)
Subjective:     Patient ID: Meghan Murray, female   DOB: 18-Oct-1970, 45 y.o.   MRN: [id number]  HPI Shirly is a 45 year old white female in complaining of hot flashes and some night sweats and is moody, more teary, "just not her self". "Feels like a failure" at times. She takes Effexor 37.5 but will open capsule and only take 1/2 because feels numb if takes the whole capsule and she says she knows she should not do that. Has nausea in mornings some and feels bloated, she has mirena IUD.She is Therapist, sports at [employer].Her daughter and 66 year old live with her.  Review of Systems Patient denies any headaches, hearing loss, fatigue, blurred vision, shortness of breath, chest pain, abdominal pain, problems with bowel movements, urination, or intercourse. No joint pain, see HPI for positives. Reviewed past medical,surgical, social and family history. Reviewed medications and allergies.      Objective:   Physical Exam BP 120/72 mmHg  Pulse 72  Ht 5\' 1"  (1.549 m)  Wt 158 lb (71.668 kg)  BMI 29.87 kg/m2  PHQ 9 score 15, discussed that since has IUD is getting progesterone but could add some estrogen back and see if better, but that taking SSRI correctly may be better at this time and she agrees, she is seeing a therapist.    Face time 15 minutes with 50 % counseling.  Assessment:      Moody Hot flashes Depression     Plan:      Stop effexor Rx lexapro 10 mg take 1 daily #30 with 6 refills Follow up in 4 weeks

## 2015-10-30 NOTE — Patient Instructions (Signed)
Take lexapro 10 mg 1 daily Follow up in 4 weeks  Major Depressive Disorder Major depressive disorder is a mental illness. It also may be called clinical depression or unipolar depression. Major depressive disorder usually causes feelings of sadness, hopelessness, or helplessness. Some people with this disorder do not feel particularly sad but lose interest in doing things they used to enjoy (anhedonia). Major depressive disorder also can cause physical symptoms. It can interfere with work, school, relationships, and other normal everyday activities. The disorder varies in severity but is longer lasting and more serious than the sadness we all feel from time to time in our lives. Major depressive disorder often is triggered by stressful life events or major life changes. Examples of these triggers include divorce, loss of your job or home, a move, and the death of a family member or close friend. Sometimes this disorder occurs for no obvious reason at all. People who have family members with major depressive disorder or bipolar disorder are at higher risk for developing this disorder, with or without life stressors. Major depressive disorder can occur at any age. It may occur just once in your life (single episode major depressive disorder). It may occur multiple times (recurrent major depressive disorder). SYMPTOMS People with major depressive disorder have either anhedonia or depressed mood on nearly a daily basis for at least 2 weeks or longer. Symptoms of depressed mood include:  Feelings of sadness (blue or down in the dumps) or emptiness.  Feelings of hopelessness or helplessness.  Tearfulness or episodes of crying (may be observed by others).  Irritability (children and adolescents). In addition to depressed mood or anhedonia or both, people with this disorder have at least four of the following symptoms:  Difficulty sleeping or sleeping too much.   Significant change (increase or decrease)  in appetite or weight.   Lack of energy or motivation.  Feelings of guilt and worthlessness.   Difficulty concentrating, remembering, or making decisions.  Unusually slow movement (psychomotor retardation) or restlessness (as observed by others).   Recurrent wishes for death, recurrent thoughts of self-harm (suicide), or a suicide attempt. People with major depressive disorder commonly have persistent negative thoughts about themselves, other people, and the world. People with severe major depressive disorder may experiencedistorted beliefs or perceptions about the world (psychotic delusions). They also may see or hear things that are not real (psychotic hallucinations). DIAGNOSIS Major depressive disorder is diagnosed through an assessment by your health care provider. Your health care provider will ask aboutaspects of your daily life, such as mood,sleep, and appetite, to see if you have the diagnostic symptoms of major depressive disorder. Your health care provider may ask about your medical history and use of alcohol or drugs, including prescription medicines. Your health care provider also may do a physical exam and blood work. This is because certain medical conditions and the use of certain substances can cause major depressive disorder-like symptoms (secondary depression). Your health care provider also may refer you to a mental health specialist for further evaluation and treatment. TREATMENT It is important to recognize the symptoms of major depressive disorder and seek treatment. The following treatments can be prescribed for this disorder:   Medicine. Antidepressant medicines usually are prescribed. Antidepressant medicines are thought to correct chemical imbalances in the brain that are commonly associated with major depressive disorder. Other types of medicine may be added if the symptoms do not respond to antidepressant medicines alone or if psychotic delusions or hallucinations  occur.  Talk therapy.  Talk therapy can be helpful in treating major depressive disorder by providing support, education, and guidance. Certain types of talk therapy also can help with negative thinking (cognitive behavioral therapy) and with relationship issues that trigger this disorder (interpersonal therapy). A mental health specialist can help determine which treatment is best for you. Most people with major depressive disorder do well with a combination of medicine and talk therapy. Treatments involving electrical stimulation of the brain can be used in situations with extremely severe symptoms or when medicine and talk therapy do not work over time. These treatments include electroconvulsive therapy, transcranial magnetic stimulation, and vagal nerve stimulation.   This information is not intended to replace advice given to you by your health care provider. Make sure you discuss any questions you have with your health care provider.   Document Released: 12/26/2012 Document Revised: 09/21/2014 Document Reviewed: 12/26/2012 Elsevier Interactive Patient Education [employer].

## 2015-11-20 ENCOUNTER — Other Ambulatory Visit: Payer: Self-pay | Admitting: Surgery

## 2015-11-20 DIAGNOSIS — R2231 Localized swelling, mass and lump, right upper limb: Secondary | ICD-10-CM

## 2015-11-27 ENCOUNTER — Encounter: Payer: Self-pay | Admitting: Adult Health

## 2015-11-27 ENCOUNTER — Other Ambulatory Visit: Payer: 59

## 2015-11-27 ENCOUNTER — Ambulatory Visit (INDEPENDENT_AMBULATORY_CARE_PROVIDER_SITE_OTHER): Payer: 59 | Admitting: Adult Health

## 2015-11-27 VITALS — BP 90/50 | HR 62 | Ht 61.0 in | Wt 160.5 lb

## 2015-11-27 DIAGNOSIS — F329 Major depressive disorder, single episode, unspecified: Secondary | ICD-10-CM

## 2015-11-27 DIAGNOSIS — F32A Depression, unspecified: Secondary | ICD-10-CM

## 2015-11-27 DIAGNOSIS — R4589 Other symptoms and signs involving emotional state: Secondary | ICD-10-CM

## 2015-11-27 DIAGNOSIS — F489 Nonpsychotic mental disorder, unspecified: Secondary | ICD-10-CM

## 2015-11-27 NOTE — Progress Notes (Signed)
Subjective:     Patient ID: Meghan Murray, female   DOB: 1971/07/25, 45 y.o.   MRN: [id number]  HPI Meghan Murray is a 45 year old white female, married, seen in February and started on stopped effexor and started lexapro for moodiness, depression and hot flashes, and has been to counseling and is 100% better, she says it is like night and day.Hot flashes have resolved.  Review of Systems Patient denies any headaches, hearing loss, fatigue, blurred vision, shortness of breath, chest pain, abdominal pain, problems with bowel movements, urination, or intercourse. No joint pain or mood swings.See HPI for positives. Reviewed past medical,surgical, social and family history. Reviewed medications and allergies.     Objective:   Physical Exam BP 90/50 mmHg  Pulse 62  Ht 5\' 1"  (1.549 m)  Wt 160 lb 8 oz (72.802 kg)  BMI 30.34 kg/m2   PHQ 9 score, 2 was 15 in February, will continue meds, face time 10 minutes  Assessment:     Moody  Depression     Plan:    Continue lexapro Follow up in July for pap and physical

## 2015-11-27 NOTE — Patient Instructions (Signed)
Continue lexapro  Return in July for pap and physical

## 2015-12-04 ENCOUNTER — Ambulatory Visit (HOSPITAL_COMMUNITY): Payer: 59

## 2015-12-04 DIAGNOSIS — R1110 Vomiting, unspecified: Secondary | ICD-10-CM | POA: Diagnosis not present

## 2015-12-12 ENCOUNTER — Ambulatory Visit (HOSPITAL_COMMUNITY)
Admission: RE | Admit: 2015-12-12 | Discharge: 2015-12-12 | Disposition: A | Discharge status: Home / Self Care | Payer: 59 | Source: Ambulatory Visit | Attending: Surgery | Admitting: Surgery

## 2015-12-12 ENCOUNTER — Encounter (HOSPITAL_COMMUNITY): Payer: Self-pay

## 2015-12-12 DIAGNOSIS — R2231 Localized swelling, mass and lump, right upper limb: Secondary | ICD-10-CM | POA: Insufficient documentation

## 2015-12-12 DIAGNOSIS — R222 Localized swelling, mass and lump, trunk: Secondary | ICD-10-CM | POA: Diagnosis not present

## 2015-12-12 MED ORDER — IOPAMIDOL (ISOVUE-300) INJECTION 61%
75.0000 mL | Freq: Once | INTRAVENOUS | Status: AC | PRN
  Administered 2015-12-12: 75 mL via INTRAVENOUS

## 2016-02-06 DIAGNOSIS — H5213 Myopia, bilateral: Secondary | ICD-10-CM | POA: Diagnosis not present

## 2016-03-11 DIAGNOSIS — L570 Actinic keratosis: Secondary | ICD-10-CM | POA: Diagnosis not present

## 2016-03-11 DIAGNOSIS — D1800 Hemangioma unspecified site: Secondary | ICD-10-CM | POA: Diagnosis not present

## 2016-04-08 ENCOUNTER — Other Ambulatory Visit: Payer: 59 | Admitting: Adult Health

## 2016-04-16 ENCOUNTER — Ambulatory Visit (INDEPENDENT_AMBULATORY_CARE_PROVIDER_SITE_OTHER): Payer: 59 | Admitting: Adult Health

## 2016-04-16 ENCOUNTER — Encounter: Payer: Self-pay | Admitting: Adult Health

## 2016-04-16 ENCOUNTER — Other Ambulatory Visit (HOSPITAL_COMMUNITY)
Admission: RE | Admit: 2016-04-16 | Discharge: 2016-04-16 | Disposition: A | Discharge status: Home / Self Care | Payer: 59 | Source: Ambulatory Visit | Attending: Adult Health | Admitting: Adult Health

## 2016-04-16 VITALS — BP 120/70 | HR 56 | Ht 62.0 in | Wt 139.0 lb

## 2016-04-16 DIAGNOSIS — R599 Enlarged lymph nodes, unspecified: Secondary | ICD-10-CM | POA: Insufficient documentation

## 2016-04-16 DIAGNOSIS — Z01411 Encounter for gynecological examination (general) (routine) with abnormal findings: Secondary | ICD-10-CM

## 2016-04-16 DIAGNOSIS — Z1211 Encounter for screening for malignant neoplasm of colon: Secondary | ICD-10-CM

## 2016-04-16 DIAGNOSIS — R591 Generalized enlarged lymph nodes: Secondary | ICD-10-CM

## 2016-04-16 DIAGNOSIS — F329 Major depressive disorder, single episode, unspecified: Secondary | ICD-10-CM | POA: Diagnosis not present

## 2016-04-16 DIAGNOSIS — Z1151 Encounter for screening for human papillomavirus (HPV): Secondary | ICD-10-CM | POA: Diagnosis not present

## 2016-04-16 DIAGNOSIS — Z01419 Encounter for gynecological examination (general) (routine) without abnormal findings: Secondary | ICD-10-CM | POA: Diagnosis not present

## 2016-04-16 DIAGNOSIS — F32A Depression, unspecified: Secondary | ICD-10-CM

## 2016-04-16 DIAGNOSIS — Z30430 Encounter for insertion of intrauterine contraceptive device: Secondary | ICD-10-CM | POA: Insufficient documentation

## 2016-04-16 DIAGNOSIS — Z975 Presence of (intrauterine) contraceptive device: Secondary | ICD-10-CM

## 2016-04-16 HISTORY — DX: Presence of (intrauterine) contraceptive device: Z97.5

## 2016-04-16 HISTORY — DX: Enlarged lymph nodes, unspecified: R59.9

## 2016-04-16 LAB — HEMOCCULT GUIAC POC 1CARD (OFFICE): FECAL OCCULT BLD: NEGATIVE

## 2016-04-16 MED ORDER — ESCITALOPRAM OXALATE 10 MG PO TABS: 10.0000 mg | ORAL_TABLET | Freq: Every day | ORAL | 12 refills | Status: DC

## 2016-04-16 NOTE — Progress Notes (Signed)
Patient ID: Meghan Murray, female   DOB: 03-22-1971, 44 y.o.   MRN: [id number] History of Present Illness: Meghan Murray is a 45 year old white female, married in for a well woman gyn exam and pap.She has an IUD and no periods and loves it.She says lexapro is great.She has enlarged lymph nodes under right arm for over a year and had Ct and saw Dr Rush Farmer and he told her not worry, will watch.  PCP is [facility].   Current Medications, Allergies, Past Medical History, Past Surgical History, Family History and Social History were reviewed in [employer] record.     Review of Systems: Patient denies any daily headaches, hearing loss, fatigue, blurred vision, shortness of breath, chest pain, abdominal pain, problems with bowel movements, urination, or intercourse. No joint pain or mood swings. See HPI for positives.    Physical Exam:BP 120/70 (BP Location: Left Arm, Patient Position: Sitting, Cuff Size: Normal)   Pulse (!) 56   Ht 5\' 2"  (1.575 m)   Wt 139 lb (63 kg)   BMI 25.42 kg/m  General:  Well developed, well nourished, no acute distress Skin:  Warm and dry Neck:  Midline trachea, normal thyroid, good ROM, no lymphadenopathy Lungs; Clear to auscultation bilaterally Breast:  No dominant palpable mass, retraction, or nipple discharge,has enlarged tender lymph nodes under right arm. Cardiovascular: Regular rate and rhythm Abdomen:  Soft, non tender, no hepatosplenomegaly Pelvic:  External genitalia is normal in appearance, no lesions.  The vagina is normal in appearance. Urethra has no lesions or masses. The cervix is bulbous, deviated to the right with +IUD strings, pap with HPV performed. Uterus is felt to be normal size, shape, and contour.  No adnexal masses or tenderness noted.Bladder is non tender, no masses felt. Rectal: Good sphincter tone, no polyps, or hemorrhoids felt.  Hemoccult negative. Extremities/musculoskeletal:  No swelling or varicosities noted, no  clubbing or cyanosis Psych:  No mood changes, alert and cooperative,seems happy   Impression: Well woman gyn exam and pap IUD in place Depression  Enlarged lymph nodes right under arm    Plan: Physical in 1 year, pap in 3 if normal  Mammogram yearly Labs with PCP Refilled lexapro 10 mg #30 take 1 daily with 12 refills Change deodorant to gel from solid to see if helps at all

## 2016-04-16 NOTE — Patient Instructions (Addendum)
Physical in 1 year, pap in 3 if norma; Mammogram yearly Change deodorant to see if helps

## 2016-04-20 ENCOUNTER — Telehealth: Payer: Self-pay | Admitting: Adult Health

## 2016-04-20 LAB — CYTOLOGY - PAP

## 2016-04-20 NOTE — Telephone Encounter (Signed)
Left message that pap was normal and negative HPV but had yeast if itching can use monistat and if not, you do not have to do anything

## 2016-10-08 DIAGNOSIS — Z6827 Body mass index (BMI) 27.0-27.9, adult: Secondary | ICD-10-CM | POA: Diagnosis not present

## 2016-10-08 DIAGNOSIS — M545 Low back pain: Secondary | ICD-10-CM | POA: Diagnosis not present

## 2016-10-23 DIAGNOSIS — Z6827 Body mass index (BMI) 27.0-27.9, adult: Secondary | ICD-10-CM | POA: Diagnosis not present

## 2016-10-23 DIAGNOSIS — J111 Influenza due to unidentified influenza virus with other respiratory manifestations: Secondary | ICD-10-CM | POA: Diagnosis not present

## 2016-11-27 DIAGNOSIS — M5442 Lumbago with sciatica, left side: Secondary | ICD-10-CM | POA: Diagnosis not present

## 2016-11-27 MED FILL — GABAPENTIN 300 MG CAPSULE: 300 | 32 days supply | Qty: 60 | Fill #0

## 2016-12-09 DIAGNOSIS — M5442 Lumbago with sciatica, left side: Secondary | ICD-10-CM | POA: Diagnosis not present

## 2016-12-14 DIAGNOSIS — M5442 Lumbago with sciatica, left side: Secondary | ICD-10-CM | POA: Diagnosis not present

## 2016-12-30 DIAGNOSIS — L570 Actinic keratosis: Secondary | ICD-10-CM | POA: Diagnosis not present

## 2016-12-30 DIAGNOSIS — D225 Melanocytic nevi of trunk: Secondary | ICD-10-CM | POA: Diagnosis not present

## 2016-12-30 DIAGNOSIS — D1800 Hemangioma unspecified site: Secondary | ICD-10-CM | POA: Diagnosis not present

## 2016-12-30 DIAGNOSIS — D485 Neoplasm of uncertain behavior of skin: Secondary | ICD-10-CM | POA: Diagnosis not present

## 2017-01-05 ENCOUNTER — Other Ambulatory Visit: Payer: Self-pay | Admitting: Physician Assistant

## 2017-01-05 DIAGNOSIS — Z1231 Encounter for screening mammogram for malignant neoplasm of breast: Secondary | ICD-10-CM

## 2017-01-07 DIAGNOSIS — N951 Menopausal and female climacteric states: Secondary | ICD-10-CM | POA: Diagnosis not present

## 2017-01-11 DIAGNOSIS — E539 Vitamin B deficiency, unspecified: Secondary | ICD-10-CM | POA: Diagnosis not present

## 2017-01-11 DIAGNOSIS — N951 Menopausal and female climacteric states: Secondary | ICD-10-CM | POA: Diagnosis not present

## 2017-01-13 DIAGNOSIS — N951 Menopausal and female climacteric states: Secondary | ICD-10-CM | POA: Diagnosis not present

## 2017-01-25 ENCOUNTER — Ambulatory Visit
Admission: RE | Admit: 2017-01-25 | Discharge: 2017-01-25 | Disposition: A | Discharge status: Home / Self Care | Payer: 59 | Source: Ambulatory Visit | Attending: Physician Assistant | Admitting: Physician Assistant

## 2017-01-25 DIAGNOSIS — Z1231 Encounter for screening mammogram for malignant neoplasm of breast: Secondary | ICD-10-CM | POA: Diagnosis not present

## 2017-02-09 DIAGNOSIS — N951 Menopausal and female climacteric states: Secondary | ICD-10-CM | POA: Diagnosis not present

## 2017-02-10 DIAGNOSIS — F39 Unspecified mood [affective] disorder: Secondary | ICD-10-CM | POA: Diagnosis not present

## 2017-02-10 DIAGNOSIS — R6882 Decreased libido: Secondary | ICD-10-CM | POA: Diagnosis not present

## 2017-02-10 DIAGNOSIS — N951 Menopausal and female climacteric states: Secondary | ICD-10-CM | POA: Diagnosis not present

## 2017-02-10 DIAGNOSIS — R5383 Other fatigue: Secondary | ICD-10-CM | POA: Diagnosis not present

## 2017-03-03 DIAGNOSIS — Z6827 Body mass index (BMI) 27.0-27.9, adult: Secondary | ICD-10-CM | POA: Diagnosis not present

## 2017-03-03 DIAGNOSIS — S80862A Insect bite (nonvenomous), left lower leg, initial encounter: Secondary | ICD-10-CM | POA: Diagnosis not present

## 2017-04-12 DIAGNOSIS — H524 Presbyopia: Secondary | ICD-10-CM | POA: Diagnosis not present

## 2017-04-12 DIAGNOSIS — H5213 Myopia, bilateral: Secondary | ICD-10-CM | POA: Diagnosis not present

## 2017-05-10 DIAGNOSIS — R6882 Decreased libido: Secondary | ICD-10-CM | POA: Diagnosis not present

## 2017-05-19 DIAGNOSIS — F39 Unspecified mood [affective] disorder: Secondary | ICD-10-CM | POA: Diagnosis not present

## 2017-05-19 DIAGNOSIS — R5383 Other fatigue: Secondary | ICD-10-CM | POA: Diagnosis not present

## 2017-05-19 DIAGNOSIS — R232 Flushing: Secondary | ICD-10-CM | POA: Diagnosis not present

## 2017-05-19 DIAGNOSIS — N951 Menopausal and female climacteric states: Secondary | ICD-10-CM | POA: Diagnosis not present

## 2017-05-19 DIAGNOSIS — R6882 Decreased libido: Secondary | ICD-10-CM | POA: Diagnosis not present

## 2017-07-19 ENCOUNTER — Other Ambulatory Visit: Payer: Self-pay | Admitting: Adult Health

## 2017-09-20 DIAGNOSIS — R232 Flushing: Secondary | ICD-10-CM | POA: Diagnosis not present

## 2017-09-20 DIAGNOSIS — R5383 Other fatigue: Secondary | ICD-10-CM | POA: Diagnosis not present

## 2017-09-20 DIAGNOSIS — R6882 Decreased libido: Secondary | ICD-10-CM | POA: Diagnosis not present

## 2017-09-20 DIAGNOSIS — N951 Menopausal and female climacteric states: Secondary | ICD-10-CM | POA: Diagnosis not present

## 2017-09-29 DIAGNOSIS — R232 Flushing: Secondary | ICD-10-CM | POA: Diagnosis not present

## 2017-09-29 DIAGNOSIS — F39 Unspecified mood [affective] disorder: Secondary | ICD-10-CM | POA: Diagnosis not present

## 2017-09-29 DIAGNOSIS — E039 Hypothyroidism, unspecified: Secondary | ICD-10-CM | POA: Diagnosis not present

## 2017-09-29 DIAGNOSIS — R5383 Other fatigue: Secondary | ICD-10-CM | POA: Diagnosis not present

## 2017-09-29 DIAGNOSIS — N951 Menopausal and female climacteric states: Secondary | ICD-10-CM | POA: Diagnosis not present

## 2017-10-26 ENCOUNTER — Other Ambulatory Visit: Payer: Self-pay | Admitting: Adult Health

## 2017-12-15 DIAGNOSIS — Z6825 Body mass index (BMI) 25.0-25.9, adult: Secondary | ICD-10-CM | POA: Diagnosis not present

## 2017-12-15 DIAGNOSIS — L710 Perioral dermatitis: Secondary | ICD-10-CM | POA: Diagnosis not present

## 2017-12-29 DIAGNOSIS — D239 Other benign neoplasm of skin, unspecified: Secondary | ICD-10-CM | POA: Diagnosis not present

## 2017-12-29 DIAGNOSIS — D1800 Hemangioma unspecified site: Secondary | ICD-10-CM | POA: Diagnosis not present

## 2017-12-29 DIAGNOSIS — L819 Disorder of pigmentation, unspecified: Secondary | ICD-10-CM | POA: Diagnosis not present

## 2017-12-29 DIAGNOSIS — L570 Actinic keratosis: Secondary | ICD-10-CM | POA: Diagnosis not present

## 2018-01-24 DIAGNOSIS — N951 Menopausal and female climacteric states: Secondary | ICD-10-CM | POA: Diagnosis not present

## 2018-01-24 DIAGNOSIS — R232 Flushing: Secondary | ICD-10-CM | POA: Diagnosis not present

## 2018-01-24 DIAGNOSIS — F39 Unspecified mood [affective] disorder: Secondary | ICD-10-CM | POA: Diagnosis not present

## 2018-01-26 DIAGNOSIS — R232 Flushing: Secondary | ICD-10-CM | POA: Diagnosis not present

## 2018-01-26 DIAGNOSIS — F39 Unspecified mood [affective] disorder: Secondary | ICD-10-CM | POA: Diagnosis not present

## 2018-01-26 DIAGNOSIS — E039 Hypothyroidism, unspecified: Secondary | ICD-10-CM | POA: Diagnosis not present

## 2018-01-26 DIAGNOSIS — R5383 Other fatigue: Secondary | ICD-10-CM | POA: Diagnosis not present

## 2018-01-26 DIAGNOSIS — N951 Menopausal and female climacteric states: Secondary | ICD-10-CM | POA: Diagnosis not present

## 2018-02-18 DIAGNOSIS — Z6825 Body mass index (BMI) 25.0-25.9, adult: Secondary | ICD-10-CM | POA: Diagnosis not present

## 2018-02-18 DIAGNOSIS — F329 Major depressive disorder, single episode, unspecified: Secondary | ICD-10-CM | POA: Diagnosis not present

## 2018-02-21 DIAGNOSIS — R300 Dysuria: Secondary | ICD-10-CM | POA: Diagnosis not present

## 2018-02-21 DIAGNOSIS — Z6824 Body mass index (BMI) 24.0-24.9, adult: Secondary | ICD-10-CM | POA: Diagnosis not present

## 2018-03-01 ENCOUNTER — Other Ambulatory Visit: Payer: Self-pay | Admitting: Physician Assistant

## 2018-03-01 DIAGNOSIS — Z1231 Encounter for screening mammogram for malignant neoplasm of breast: Secondary | ICD-10-CM

## 2018-03-09 DIAGNOSIS — Z0000 Encounter for general adult medical examination without abnormal findings: Secondary | ICD-10-CM | POA: Diagnosis not present

## 2018-03-09 DIAGNOSIS — F329 Major depressive disorder, single episode, unspecified: Secondary | ICD-10-CM | POA: Diagnosis not present

## 2018-03-09 DIAGNOSIS — Z6824 Body mass index (BMI) 24.0-24.9, adult: Secondary | ICD-10-CM | POA: Diagnosis not present

## 2018-03-22 ENCOUNTER — Ambulatory Visit
Admission: RE | Admit: 2018-03-22 | Discharge: 2018-03-22 | Disposition: A | Discharge status: Home / Self Care | Payer: 59 | Source: Ambulatory Visit | Attending: Physician Assistant | Admitting: Physician Assistant

## 2018-03-22 DIAGNOSIS — Z1231 Encounter for screening mammogram for malignant neoplasm of breast: Secondary | ICD-10-CM

## 2018-04-18 DIAGNOSIS — H524 Presbyopia: Secondary | ICD-10-CM | POA: Diagnosis not present

## 2018-05-30 DIAGNOSIS — F39 Unspecified mood [affective] disorder: Secondary | ICD-10-CM | POA: Diagnosis not present

## 2018-05-30 DIAGNOSIS — R232 Flushing: Secondary | ICD-10-CM | POA: Diagnosis not present

## 2018-05-30 DIAGNOSIS — N951 Menopausal and female climacteric states: Secondary | ICD-10-CM | POA: Diagnosis not present

## 2018-05-30 DIAGNOSIS — R5383 Other fatigue: Secondary | ICD-10-CM | POA: Diagnosis not present

## 2018-05-30 DIAGNOSIS — E039 Hypothyroidism, unspecified: Secondary | ICD-10-CM | POA: Diagnosis not present

## 2018-06-01 DIAGNOSIS — E039 Hypothyroidism, unspecified: Secondary | ICD-10-CM | POA: Diagnosis not present

## 2018-06-01 DIAGNOSIS — N951 Menopausal and female climacteric states: Secondary | ICD-10-CM | POA: Diagnosis not present

## 2018-06-01 DIAGNOSIS — R5383 Other fatigue: Secondary | ICD-10-CM | POA: Diagnosis not present

## 2018-06-01 DIAGNOSIS — R232 Flushing: Secondary | ICD-10-CM | POA: Diagnosis not present

## 2018-06-01 MED FILL — ARMOUR THYROID 30 MG TABLET: 30 | 30 days supply | Qty: 30 | Fill #0

## 2018-08-29 DIAGNOSIS — Z23 Encounter for immunization: Secondary | ICD-10-CM | POA: Diagnosis not present

## 2018-08-29 DIAGNOSIS — Z111 Encounter for screening for respiratory tuberculosis: Secondary | ICD-10-CM | POA: Diagnosis not present

## 2018-09-13 DIAGNOSIS — Z111 Encounter for screening for respiratory tuberculosis: Secondary | ICD-10-CM | POA: Diagnosis not present

## 2018-09-27 DIAGNOSIS — R6882 Decreased libido: Secondary | ICD-10-CM | POA: Diagnosis not present

## 2018-09-27 DIAGNOSIS — N951 Menopausal and female climacteric states: Secondary | ICD-10-CM | POA: Diagnosis not present

## 2018-09-27 DIAGNOSIS — F39 Unspecified mood [affective] disorder: Secondary | ICD-10-CM | POA: Diagnosis not present

## 2018-09-27 DIAGNOSIS — R5383 Other fatigue: Secondary | ICD-10-CM | POA: Diagnosis not present

## 2018-10-03 DIAGNOSIS — R232 Flushing: Secondary | ICD-10-CM | POA: Diagnosis not present

## 2018-10-03 DIAGNOSIS — N951 Menopausal and female climacteric states: Secondary | ICD-10-CM | POA: Diagnosis not present

## 2018-10-03 DIAGNOSIS — E039 Hypothyroidism, unspecified: Secondary | ICD-10-CM | POA: Diagnosis not present

## 2018-10-03 DIAGNOSIS — R6882 Decreased libido: Secondary | ICD-10-CM | POA: Diagnosis not present

## 2018-10-17 DIAGNOSIS — N3000 Acute cystitis without hematuria: Secondary | ICD-10-CM | POA: Diagnosis not present

## 2018-10-17 DIAGNOSIS — Z6825 Body mass index (BMI) 25.0-25.9, adult: Secondary | ICD-10-CM | POA: Diagnosis not present

## 2018-10-17 DIAGNOSIS — R350 Frequency of micturition: Secondary | ICD-10-CM | POA: Diagnosis not present

## 2018-11-04 ENCOUNTER — Ambulatory Visit (INDEPENDENT_AMBULATORY_CARE_PROVIDER_SITE_OTHER): Payer: 59 | Admitting: Obstetrics & Gynecology

## 2018-11-04 ENCOUNTER — Encounter: Payer: Self-pay | Admitting: Obstetrics & Gynecology

## 2018-11-04 VITALS — BP 106/59 | HR 60 | Ht 61.0 in | Wt 137.0 lb

## 2018-11-04 DIAGNOSIS — N719 Inflammatory disease of uterus, unspecified: Secondary | ICD-10-CM

## 2018-11-04 DIAGNOSIS — Z975 Presence of (intrauterine) contraceptive device: Secondary | ICD-10-CM | POA: Diagnosis not present

## 2018-11-04 MED ORDER — DOXYCYCLINE HYCLATE 100 MG PO TABS: 100.0000 mg | ORAL_TABLET | Freq: Two times a day (BID) | ORAL | 0 refills | Status: DC

## 2018-11-04 NOTE — Progress Notes (Signed)
Chief Complaint  Patient presents with  . ? bladder prolapse      48 y.o. [id number] No LMP recorded. (Menstrual status: IUD). The current method of family planning is IUD.  Outpatient Encounter Medications as of 11/04/2018  Medication Sig  . escitalopram (LEXAPRO) 10 MG tablet TAKE ONE (1) TABLET EACH DAY  . IRON PO Take 50 mg by mouth daily.  Marland Kitchen levonorgestrel (MIRENA) 20 MCG/24HR IUD 1 each by Intrauterine route once.  Marland Kitchen UNABLE TO FIND OTC thyroid supplement-one daily  . VITAMIN D PO Take by mouth daily.  Marland Kitchen doxycycline (VIBRA-TABS) 100 MG tablet Take 1 tablet (100 mg total) by mouth 2 (two) times daily.  . [DISCONTINUED] Calcium-Magnesium-Vitamin D (CALCIUM MAGNESIUM PO) Take by mouth daily.   No facility-administered encounter medications on file as of 11/04/2018.     Subjective Pt has been having lower abdominal pressure and pain now for a few weeks She saw her PCP and was treated for UTI although it grew out strep by her report, which is most likely a skin contaminant She has had a course of macrobid and augmentin and her discomfort has not improved She states it never really felt like a UTI  No pain with intercourse Pain intensity has been moderate Past Medical History:  Diagnosis Date  . Depression   . Depression (emotion) 10/30/2015  . GAD (generalized anxiety disorder)   . Hot flashes 10/30/2015  . IUD (intrauterine device) in place 04/16/2016  . Lymph nodes enlarged 04/16/2016   Right under arm had CT and has seen Dr Rush Farmer   . Moody 10/30/2015  . Swollen lymph nodes    has had CT and saw Dr Rush Farmer    Past Surgical History:  Procedure Laterality Date  . ABDOMINOPLASTY    . CESAREAN SECTION     x2    OB History    Gravida  2   Para  2   Term  2   Preterm      AB      Living  2     SAB      TAB      Ectopic      Multiple      Live Births  2           Allergies  Allergen Reactions  . Flagyl [Metronidazole]   . Sulfa  Antibiotics     Social History   Socioeconomic History  . Marital status: Married    Spouse name: Not on file  . Number of children: Not on file  . Years of education: Not on file  . Highest education level: Not on file  Occupational History  . Not on file  Social Needs  . Financial resource strain: Not on file  . Food insecurity:    Worry: Not on file    Inability: Not on file  . Transportation needs:    Medical: Not on file    Non-medical: Not on file  Tobacco Use  . Smoking status: Never Smoker  . Smokeless tobacco: Never Used  Substance and Sexual Activity  . Alcohol use: No  . Drug use: No  . Sexual activity: Yes    Birth control/protection: I.U.D.  Lifestyle  . Physical activity:    Days per week: Not on file    Minutes per session: Not on file  . Stress: Not on file  Relationships  . Social connections:    Talks on phone: Not on file  Gets together: Not on file    Attends religious service: Not on file    Active member of club or organization: Not on file    Attends meetings of clubs or organizations: Not on file    Relationship status: Not on file  Other Topics Concern  . Not on file  Social History Narrative  . Not on file    Family History  Problem Relation Age of Onset  . Cancer Mother        breast  . Breast cancer Mother   . Hypertension Father   . Cancer Maternal Grandmother        breast  . Breast cancer Maternal Grandmother   . Heart attack Maternal Grandfather   . Other Paternal Grandfather        died in MVA    Medications:       Current Outpatient Medications:  .  escitalopram (LEXAPRO) 10 MG tablet, TAKE ONE (1) TABLET EACH DAY, Disp: 90 tablet, Rfl: 0 .  IRON PO, Take 50 mg by mouth daily., Disp: , Rfl:  .  levonorgestrel (MIRENA) 20 MCG/24HR IUD, 1 each by Intrauterine route once., Disp: , Rfl:  .  UNABLE TO FIND, OTC thyroid supplement-one daily, Disp: , Rfl:  .  VITAMIN D PO, Take by mouth daily., Disp: , Rfl:  .   doxycycline (VIBRA-TABS) 100 MG tablet, Take 1 tablet (100 mg total) by mouth 2 (two) times daily., Disp: 20 tablet, Rfl: 0  Objective Blood pressure (!) 106/59, pulse 60, height 5\' 1"  (1.549 m), weight 137 lb (62.1 kg).  General WDWN female NAD Vulva:  normal appearing vulva with no masses, tenderness or lesions Vagina:  normal mucosa, thin grey discharge, copious, no cystocoele is present Cervix:  tender to motion with minimal but appreciable relaxation Uterus:  uterus is not present, anteverted, tender, tender on motion and prolapsed first degree Adnexa: ovaries:present,  normal adnexa in size, nontender and no masses   Pertinent ROS No burning with urination, frequency or urgency No nausea, vomiting or diarrhea Nor fever chills or other constitutional symptoms   Labs or studies Wet Prep:   A sample of vaginal discharge was obtained from the posterior fornix using a cotton swab. 2 drops of saline were placed on a slide and the cotton swab was immersed in the saline. Microscopic evaluation was performed and results were as follows:  Negative  for yeast  Negative for clue cells , consistent with Bacterial vaginosis Negative for trichomonas  significantly elevated WBC population   Whiff test: Negative     Impression Diagnoses this Encounter::   ICD-10-CM   1. Endometritis N71.9   2. IUD (intrauterine device) in place Z97.5     Established relevant diagnosis(es):   Plan/Recommendations: Meds ordered this encounter  Medications  . doxycycline (VIBRA-TABS) 100 MG tablet    Sig: Take 1 tablet (100 mg total) by mouth 2 (two) times daily.    Dispense:  20 tablet    Refill:  0    Labs or Scans Ordered: No orders of the defined types were placed in this encounter.   Management:: >doxycycline for 10 days then follow up >IUD has been in for 5+ years, 06/2013 >will remove at follow up visit and replace 2 weeks thereafter with this endometritis issue  Follow  up Return in about 2 weeks (around 11/18/2018) for Follow up, with Dr Elonda Husky.     All questions were answered.

## 2018-11-15 ENCOUNTER — Encounter: Payer: Self-pay | Admitting: Obstetrics & Gynecology

## 2018-11-15 ENCOUNTER — Ambulatory Visit: Payer: 59 | Admitting: Obstetrics & Gynecology

## 2018-11-15 ENCOUNTER — Other Ambulatory Visit: Payer: Self-pay

## 2018-11-15 VITALS — BP 112/68 | HR 57 | Ht 61.0 in | Wt 139.0 lb

## 2018-11-15 DIAGNOSIS — Z30432 Encounter for removal of intrauterine contraceptive device: Secondary | ICD-10-CM

## 2018-11-15 NOTE — Progress Notes (Signed)
Chief Complaint  Patient presents with  . Follow-up    and IUD removal      48 y.o. [id number] No LMP recorded. (Menstrual status: IUD). The current method of family planning is IUD.  Outpatient Encounter Medications as of 11/15/2018  Medication Sig  . escitalopram (LEXAPRO) 10 MG tablet TAKE ONE (1) TABLET EACH DAY  . IRON PO Take 50 mg by mouth daily.  Marland Kitchen levonorgestrel (MIRENA) 20 MCG/24HR IUD 1 each by Intrauterine route once.  Marland Kitchen UNABLE TO FIND OTC thyroid supplement-one daily  . VITAMIN D PO Take by mouth daily.  . [DISCONTINUED] doxycycline (VIBRA-TABS) 100 MG tablet Take 1 tablet (100 mg total) by mouth 2 (two) times daily.   No facility-administered encounter medications on file as of 11/15/2018.     Subjective Pt was seen 2 weeks ago and diagnosed with endometritis ?etiology Placed on doxycycline with complete resolution of symptoms Time for IUD replacement so removal to be done today and then reinsertion in 2 weeks, do not want to replace with diagnosis and treatment so close to diagnosis Past Medical History:  Diagnosis Date  . Depression   . Depression (emotion) 10/30/2015  . GAD (generalized anxiety disorder)   . Hot flashes 10/30/2015  . IUD (intrauterine device) in place 04/16/2016  . Lymph nodes enlarged 04/16/2016   Right under arm had CT and has seen Dr Rush Farmer   . Moody 10/30/2015  . Swollen lymph nodes    has had CT and saw Dr Rush Farmer    Past Surgical History:  Procedure Laterality Date  . ABDOMINOPLASTY    . CESAREAN SECTION     x2    OB History    Gravida  2   Para  2   Term  2   Preterm      AB      Living  2     SAB      TAB      Ectopic      Multiple      Live Births  2           Allergies  Allergen Reactions  . Flagyl [Metronidazole]   . Sulfa Antibiotics     Social History   Socioeconomic History  . Marital status: Married    Spouse name: Not on file  . Number of children: Not on file  . Years of  education: Not on file  . Highest education level: Not on file  Occupational History  . Not on file  Social Needs  . Financial resource strain: Not on file  . Food insecurity:    Worry: Not on file    Inability: Not on file  . Transportation needs:    Medical: Not on file    Non-medical: Not on file  Tobacco Use  . Smoking status: Never Smoker  . Smokeless tobacco: Never Used  Substance and Sexual Activity  . Alcohol use: No  . Drug use: No  . Sexual activity: Yes    Birth control/protection: I.U.D.  Lifestyle  . Physical activity:    Days per week: Not on file    Minutes per session: Not on file  . Stress: Not on file  Relationships  . Social connections:    Talks on phone: Not on file    Gets together: Not on file    Attends religious service: Not on file    Active member of club or organization: Not on file  Attends meetings of clubs or organizations: Not on file    Relationship status: Not on file  Other Topics Concern  . Not on file  Social History Narrative  . Not on file    Family History  Problem Relation Age of Onset  . Cancer Mother        breast  . Breast cancer Mother   . Hypertension Father   . Cancer Maternal Grandmother        breast  . Breast cancer Maternal Grandmother   . Heart attack Maternal Grandfather   . Other Paternal Grandfather        died in MVA    Medications:       Current Outpatient Medications:  .  escitalopram (LEXAPRO) 10 MG tablet, TAKE ONE (1) TABLET EACH DAY, Disp: 90 tablet, Rfl: 0 .  IRON PO, Take 50 mg by mouth daily., Disp: , Rfl:  .  levonorgestrel (MIRENA) 20 MCG/24HR IUD, 1 each by Intrauterine route once., Disp: , Rfl:  .  UNABLE TO FIND, OTC thyroid supplement-one daily, Disp: , Rfl:  .  VITAMIN D PO, Take by mouth daily., Disp: , Rfl:   Objective Blood pressure 112/68, pulse (!) 57, height 5\' 1"  (1.549 m), weight 139 lb (63 kg).  Mirena IUD removed without difficulty  Pertinent ROS   Labs or  studies     Impression Diagnoses this Encounter::   ICD-10-CM   1. Encounter for IUD removal Z30.432     Established relevant diagnosis(es):   Plan/Recommendations: No orders of the defined types were placed in this encounter.   Labs or Scans Ordered: No orders of the defined types were placed in this encounter.   Management:: >IUD removal today, re insertion in 2 weeks  Follow up Return in about 2 weeks (around 11/29/2018) for Mirena IUD placement by Knute Neu.       All questions were answered.

## 2018-11-28 ENCOUNTER — Encounter: Payer: Self-pay | Admitting: Women's Health

## 2018-11-28 ENCOUNTER — Other Ambulatory Visit: Payer: Self-pay

## 2018-11-28 ENCOUNTER — Ambulatory Visit: Payer: 59 | Admitting: Women's Health

## 2018-11-28 VITALS — BP 108/64 | HR 67 | Ht 61.0 in | Wt 133.5 lb

## 2018-11-28 DIAGNOSIS — Z30430 Encounter for insertion of intrauterine contraceptive device: Secondary | ICD-10-CM | POA: Diagnosis not present

## 2018-11-28 DIAGNOSIS — Z3202 Encounter for pregnancy test, result negative: Secondary | ICD-10-CM | POA: Diagnosis not present

## 2018-11-28 LAB — POCT URINE PREGNANCY: Preg Test, Ur: NEGATIVE

## 2018-11-28 MED ORDER — LEVONORGESTREL 20 MCG/24HR IU IUD
INTRAUTERINE_SYSTEM | Freq: Once | INTRAUTERINE | Status: AC
  Administered 2018-11-28: 13:00:00 via INTRAUTERINE

## 2018-11-28 NOTE — Progress Notes (Signed)
   IUD INSERTION Patient name: Meghan Murray MRN [id number]  Date of birth: [birth year] Subjective Findings:   Meghan Murray is a 48 y.o. G16P2002 Caucasian female being seen today for insertion of a Mirena IUD. Had Mirena IUD removed 11/15/18 after tx for endometritis, that one was inserted Oct 2014. She bled x 2wks. This will be her 4th IUD.   Patient's last menstrual period was 11/16/2018. Last sexual intercourse was >2wks ago Last pap 04/16/16. Results were:  neg w/ -HRHPV  The risks and benefits of the method and placement have been thouroughly reviewed with the patient and all questions were answered.  Specifically the patient is aware of failure rate of 09/998, expulsion of the IUD and of possible perforation.  The patient is aware of irregular bleeding due to the method and understands the incidence of irregular bleeding diminishes with time.  Signed copy of informed consent in chart.  Pertinent History Reviewed:   Reviewed past medical,surgical, social, obstetrical and family history.  Reviewed problem list, medications and allergies. Objective Findings & Procedure:   Vitals:   11/28/18 1156  BP: 108/64  Pulse: 67  Weight: 133 lb 8 oz (60.6 kg)  Height: 5\' 1"  (1.549 m)  Body mass index is 25.22 kg/m.  Results for orders placed or performed in visit on 11/28/18 (from the past 24 hour(s))  POCT urine pregnancy   Collection Time: 11/28/18 12:03 PM  Result Value Ref Range   Preg Test, Ur Negative Negative     Time out was performed.  A graves speculum was placed in the vagina.  The cervix was visualized, prepped using Betadine, and grasped with a single tooth tenaculum. The uterus was found to be neutral and it sounded to 8 cm.  Mirena IUD placed per manufacturer's recommendations. The strings were trimmed to approximately 3 cm. The patient tolerated the procedure well.   Informal transvaginal sonogram was performed and the proper placement of the IUD was verified by myself  and amber, ultrasonographer. Assessment & Plan:   1) Mirena IUD insertion The patient was given post procedure instructions, including signs and symptoms of infection and to check for the strings after each menses or each month, and refraining from intercourse or anything in the vagina for 3 days. She was given a Mirena care card with date IUD placed, and date IUD to be removed. She is scheduled for a f/u appointment in 4 weeks.  Orders Placed This Encounter  Procedures  . POCT urine pregnancy    Return in about 4 weeks (around 12/26/2018) for F/U.  [place], [facility] 11/28/2018 12:45 PM

## 2018-11-28 NOTE — Patient Instructions (Signed)
 Nothing in vagina for 3 days (no sex, douching, tampons, etc...)  Check your strings once a month to make sure you can feel them, if you are not able to please let us know  If you develop a fever of 100.4 or more in the next few weeks, or if you develop severe abdominal pain, please let us know  Use a backup method of birth control, such as condoms, for 2 weeks     Intrauterine Device Insertion, Care After  This sheet gives you information about how to care for yourself after your procedure. Your health care provider may also give you more specific instructions. If you have problems or questions, contact your health care provider. What can I expect after the procedure? After the procedure, it is common to have:  Cramps and pain in the abdomen.  Light bleeding (spotting) or heavier bleeding that is like your menstrual period. This may last for up to a few days.  Lower back pain.  Dizziness.  Headaches.  Nausea. Follow these instructions at home:  Before resuming sexual activity, check to make sure that you can feel the IUD string(s). You should be able to feel the end of the string(s) below the opening of your cervix. If your IUD string is in place, you may resume sexual activity. ? If you had a hormonal IUD inserted more than 7 days after your most recent period started, you will need to use a backup method of birth control for 7 days after IUD insertion. Ask your health care provider whether this applies to you.  Continue to check that the IUD is still in place by feeling for the string(s) after every menstrual period, or once a month.  Take over-the-counter and prescription medicines only as told by your health care provider.  Do not drive or use heavy machinery while taking prescription pain medicine.  Keep all follow-up visits as told by your health care provider. This is important. Contact a health care provider if:  You have bleeding that is heavier or lasts longer  than a normal menstrual cycle.  You have a fever.  You have cramps or abdominal pain that get worse or do not get better with medicine.  You develop abdominal pain that is new or is not in the same area of earlier cramping and pain.  You feel lightheaded or weak.  You have abnormal or bad-smelling discharge from your vagina.  You have pain during sexual activity.  You have any of the following problems with your IUD string(s): ? The string bothers or hurts you or your sexual partner. ? You cannot feel the string. ? The string has gotten longer.  You can feel the IUD in your vagina.  You think you may be pregnant, or you miss your menstrual period.  You think you may have an STI (sexually transmitted infection). Get help right away if:  You have flu-like symptoms.  You have a fever and chills.  You can feel that your IUD has slipped out of place. Summary  After the procedure, it is common to have cramps and pain in the abdomen. It is also common to have light bleeding (spotting) or heavier bleeding that is like your menstrual period.  Continue to check that the IUD is still in place by feeling for the string(s) after every menstrual period, or once a month.  Keep all follow-up visits as told by your health care provider. This is important.  Contact your health care provider   you have problems with your IUD string(s), such as the string getting longer or bothering you or your sexual partner. This information is not intended to replace advice given to you by your health care provider. Make sure you discuss any questions you have with your health care provider. Document Released: 04/29/2011 Document Revised: 07/22/2016 Document Reviewed: 07/22/2016 Elsevier Interactive Patient Education  2019 [employer].

## 2018-11-28 NOTE — Addendum Note (Signed)
Addended by: Linton Rump on: 11/28/2018 12:49 PM   Modules accepted: Orders

## 2018-12-26 ENCOUNTER — Encounter: Payer: Self-pay | Admitting: Women's Health

## 2018-12-26 ENCOUNTER — Ambulatory Visit (INDEPENDENT_AMBULATORY_CARE_PROVIDER_SITE_OTHER): Payer: 59 | Admitting: Women's Health

## 2018-12-26 ENCOUNTER — Other Ambulatory Visit: Payer: Self-pay

## 2018-12-26 DIAGNOSIS — Z30431 Encounter for routine checking of intrauterine contraceptive device: Secondary | ICD-10-CM

## 2018-12-26 NOTE — Progress Notes (Signed)
   TELEHEALTH VIRTUAL GYNECOLOGY VISIT ENCOUNTER NOTE  I connected with Meghan Murray on 12/26/18 at 11:30 AM EDT by telephone at home and verified that I am speaking with the correct person using two identifiers.   I discussed the limitations, risks, security and privacy concerns of performing an evaluation and management service by telephone and the availability of in person appointments. I also discussed with the patient that there may be a patient responsible charge related to this service. The patient expressed understanding and agreed to proceed.   History:  Meghan Murray is a 48 y.o. G35P2002 female being evaluated today for IUD f/u. She had a  Mirena IUD inserted 11/28/18. Doing well, no complaints. Does not check strings. Had some spotting, gone now. No pain w/ sex. She denies any abnormal vaginal discharge, bleeding, pelvic pain or other concerns.       Past Medical History:  Diagnosis Date  . Depression   . Depression (emotion) 10/30/2015  . GAD (generalized anxiety disorder)   . Hot flashes 10/30/2015  . IUD (intrauterine device) in place 04/16/2016  . Lymph nodes enlarged 04/16/2016   Right under arm had CT and has seen Dr Rush Farmer   . Moody 10/30/2015  . Swollen lymph nodes    has had CT and saw Dr Rush Farmer   Past Surgical History:  Procedure Laterality Date  . ABDOMINOPLASTY    . CESAREAN SECTION     x2   The following portions of the patient's history were reviewed and updated as appropriate: allergies, current medications, past family history, past medical history, past social history, past surgical history and problem list.   Health Maintenance:  Normal pap and negative HRHPV on 04/16/16.    Review of Systems:  Pertinent items noted in HPI and remainder of comprehensive ROS otherwise negative.  Physical Exam:   General:  Alert, oriented and cooperative.   Mental Status: Normal mood and affect perceived. Normal judgment and thought content.  Physical exam  deferred due to nature of the encounter  Labs and Imaging No results found for this or any previous visit (from the past 336 hour(s)). No results found.    Assessment and Plan:     IUD f/u> doing well since Mirena inserted 11/28/18, no problems      I discussed the assessment and treatment plan with the patient. The patient was provided an opportunity to ask questions and all were answered. The patient agreed with the plan and demonstrated an understanding of the instructions.   The patient was advised to call back or seek an in-person evaluation/go to the ED if the symptoms worsen or if the condition fails to improve as anticipated.  I provided 5 minutes of non-face-to-face time during this encounter.  F/U in Aug for pap & physical   Roma Schanz, [place] for [place]

## 2019-01-02 DIAGNOSIS — D485 Neoplasm of uncertain behavior of skin: Secondary | ICD-10-CM | POA: Diagnosis not present

## 2019-01-02 DIAGNOSIS — L570 Actinic keratosis: Secondary | ICD-10-CM | POA: Diagnosis not present

## 2019-01-02 DIAGNOSIS — L720 Epidermal cyst: Secondary | ICD-10-CM | POA: Diagnosis not present

## 2019-01-02 DIAGNOSIS — D239 Other benign neoplasm of skin, unspecified: Secondary | ICD-10-CM | POA: Diagnosis not present

## 2019-01-02 DIAGNOSIS — D2271 Melanocytic nevi of right lower limb, including hip: Secondary | ICD-10-CM | POA: Diagnosis not present

## 2019-01-02 DIAGNOSIS — D2272 Melanocytic nevi of left lower limb, including hip: Secondary | ICD-10-CM | POA: Diagnosis not present

## 2019-01-02 DIAGNOSIS — D1801 Hemangioma of skin and subcutaneous tissue: Secondary | ICD-10-CM | POA: Diagnosis not present

## 2019-01-05 DIAGNOSIS — L720 Epidermal cyst: Secondary | ICD-10-CM | POA: Diagnosis not present

## 2019-01-05 DIAGNOSIS — D485 Neoplasm of uncertain behavior of skin: Secondary | ICD-10-CM | POA: Diagnosis not present

## 2019-02-13 DIAGNOSIS — L720 Epidermal cyst: Secondary | ICD-10-CM | POA: Diagnosis not present

## 2019-02-23 DIAGNOSIS — N951 Menopausal and female climacteric states: Secondary | ICD-10-CM | POA: Diagnosis not present

## 2019-02-23 DIAGNOSIS — R232 Flushing: Secondary | ICD-10-CM | POA: Diagnosis not present

## 2019-02-23 DIAGNOSIS — E039 Hypothyroidism, unspecified: Secondary | ICD-10-CM | POA: Diagnosis not present

## 2019-02-23 DIAGNOSIS — R6882 Decreased libido: Secondary | ICD-10-CM | POA: Diagnosis not present

## 2019-03-08 DIAGNOSIS — R5383 Other fatigue: Secondary | ICD-10-CM | POA: Diagnosis not present

## 2019-03-08 DIAGNOSIS — N951 Menopausal and female climacteric states: Secondary | ICD-10-CM | POA: Diagnosis not present

## 2019-03-08 DIAGNOSIS — F39 Unspecified mood [affective] disorder: Secondary | ICD-10-CM | POA: Diagnosis not present

## 2019-05-16 DIAGNOSIS — Z973 Presence of spectacles and contact lenses: Secondary | ICD-10-CM | POA: Diagnosis not present

## 2019-05-16 DIAGNOSIS — H524 Presbyopia: Secondary | ICD-10-CM | POA: Diagnosis not present

## 2019-06-26 DIAGNOSIS — L738 Other specified follicular disorders: Secondary | ICD-10-CM | POA: Diagnosis not present

## 2019-06-26 DIAGNOSIS — L570 Actinic keratosis: Secondary | ICD-10-CM | POA: Diagnosis not present

## 2019-07-03 ENCOUNTER — Other Ambulatory Visit: Payer: Self-pay | Admitting: Physician Assistant

## 2019-07-03 DIAGNOSIS — Z1231 Encounter for screening mammogram for malignant neoplasm of breast: Secondary | ICD-10-CM

## 2019-07-05 DIAGNOSIS — N951 Menopausal and female climacteric states: Secondary | ICD-10-CM | POA: Diagnosis not present

## 2019-07-05 DIAGNOSIS — E039 Hypothyroidism, unspecified: Secondary | ICD-10-CM | POA: Diagnosis not present

## 2019-07-05 DIAGNOSIS — R5383 Other fatigue: Secondary | ICD-10-CM | POA: Diagnosis not present

## 2019-07-11 DIAGNOSIS — R05 Cough: Secondary | ICD-10-CM | POA: Diagnosis not present

## 2019-07-11 DIAGNOSIS — J029 Acute pharyngitis, unspecified: Secondary | ICD-10-CM | POA: Diagnosis not present

## 2019-07-12 DIAGNOSIS — N951 Menopausal and female climacteric states: Secondary | ICD-10-CM | POA: Diagnosis not present

## 2019-07-12 DIAGNOSIS — F39 Unspecified mood [affective] disorder: Secondary | ICD-10-CM | POA: Diagnosis not present

## 2019-07-12 DIAGNOSIS — R5383 Other fatigue: Secondary | ICD-10-CM | POA: Diagnosis not present

## 2019-07-12 DIAGNOSIS — R232 Flushing: Secondary | ICD-10-CM | POA: Diagnosis not present

## 2019-07-27 ENCOUNTER — Encounter: Payer: Self-pay | Admitting: Orthopaedic Surgery

## 2019-07-27 ENCOUNTER — Ambulatory Visit (INDEPENDENT_AMBULATORY_CARE_PROVIDER_SITE_OTHER): Payer: 59

## 2019-07-27 ENCOUNTER — Ambulatory Visit (INDEPENDENT_AMBULATORY_CARE_PROVIDER_SITE_OTHER): Payer: 59 | Admitting: Orthopaedic Surgery

## 2019-07-27 ENCOUNTER — Other Ambulatory Visit: Payer: Self-pay

## 2019-07-27 VITALS — BP 122/78 | HR 56 | Ht 61.0 in | Wt 130.0 lb

## 2019-07-27 DIAGNOSIS — G8929 Other chronic pain: Secondary | ICD-10-CM

## 2019-07-27 DIAGNOSIS — M67921 Unspecified disorder of synovium and tendon, right upper arm: Secondary | ICD-10-CM | POA: Diagnosis not present

## 2019-07-27 DIAGNOSIS — M25511 Pain in right shoulder: Secondary | ICD-10-CM

## 2019-07-27 MED ORDER — BUPIVACAINE HCL 0.25 % IJ SOLN
2.0000 mL | INTRAMUSCULAR | Status: AC | PRN
  Administered 2019-07-27: 2 mL via INTRA_ARTICULAR

## 2019-07-27 MED ORDER — METHYLPREDNISOLONE ACETATE 40 MG/ML IJ SUSP
40.0000 mg | INTRAMUSCULAR | Status: AC | PRN
  Administered 2019-07-27: 40 mg via INTRA_ARTICULAR

## 2019-07-27 MED ORDER — LIDOCAINE HCL 1 % IJ SOLN
0.5000 mL | INTRAMUSCULAR | Status: AC | PRN
  Administered 2019-07-27: .5 mL

## 2019-07-27 NOTE — Progress Notes (Signed)
Office Visit Note   Patient: Meghan Murray           Date of Birth: 1971/07/21           MRN: [id number] Visit Date: 07/27/2019              Requested by: Caryl Bis, MD [place],  [place] [id number] PCP: Caryl Bis, MD   Assessment & Plan: Visit Diagnoses:  1. Chronic right shoulder pain   2. Tendinopathy of right biceps tendon     Plan: Anterior injection performed around the long head of the biceps tendon with reproduction of her pain symptoms.  Postinjection she noted some relief.  We will check her back again in 3 weeks if she is having persistent symptoms she may require MRI scan rule out superior labral tear with biceps tendinopathy.  We discussed activity modification avoid activities that would load the injured area.  Recheck 3 weeks.  Follow-Up Instructions: Return in about 3 weeks (around 08/17/2019).   Orders:  Orders Placed This Encounter  Procedures  . XR Shoulder Right   No orders of the defined types were placed in this encounter.     Procedures: Large Joint Inj: R glenohumeral on 07/27/2019 1:50 PM Indications: pain Details: 22 G 1.5 in needle  Arthrogram: No  Medications: 40 mg methylPREDNISolone acetate 40 MG/ML; 0.5 mL lidocaine 1 %; 2 mL bupivacaine 0.25 % Outcome: tolerated well, no immediate complications Procedure, treatment alternatives, risks and benefits explained, specific risks discussed. Consent was given by the patient. Immediately prior to procedure a time out was called to verify the correct patient, procedure, equipment, support staff and site/side marked as required. Patient was prepped and draped in the usual sterile fashion.       Clinical Data: No additional findings.   Subjective: Chief Complaint  Patient presents with  . Right Shoulder - Pain    HPI 48 year old female with right shoulder pain for a few months.  She has been using ibuprofen regularly.  She does [employer] and originally had some problems  after a lot of push-ups she rested it for several weeks and then when they were doing [employer] press type exercises rapidly in April she had recurrence of pain.  She is unable to lay on her right shoulder.  No numbness or tingling in her fingers.  Some pain is posteriorly over her shoulder.  Pain is reproduced with biceps contraction and shoulder elevation.  Review of Systems positive for depression with anxiety.  Current right shoulder pain.  Otherwise negative as it pertains to HPI.   Objective: Vital Signs: BP 122/78   Pulse (!) 56   Ht 5\' 1"  (1.549 m)   Wt 130 lb (59 kg)   BMI 24.56 kg/m   Physical Exam Constitutional:      Appearance: She is well-developed.  HENT:     Head: Normocephalic.     Right Ear: External ear normal.     Left Ear: External ear normal.  Eyes:     Pupils: Pupils are equal, round, and reactive to light.  Neck:     Thyroid: No thyromegaly.     Trachea: No tracheal deviation.  Cardiovascular:     Rate and Rhythm: Normal rate.  Pulmonary:     Effort: Pulmonary effort is normal.  Abdominal:     Palpations: Abdomen is soft.  Skin:    General: Skin is warm and dry.  Neurological:     Mental Status:  She is alert and oriented to person, place, and time.  Psychiatric:        Behavior: Behavior normal.     Ortho Exam patient has negative drop arm test.  Good deltoid development.  Exquisite tenderness of the long of the biceps tendon some pain posteriorly at the joint capsule.  No subluxation of the shoulder. Specialty Comments:  No specialty comments available.  Imaging: No results found.   PMFS History: Patient Active Problem List   Diagnosis Date Noted  . Tendinopathy of right biceps tendon 07/27/2019  . Encounter for insertion of mirena IUD 04/16/2016  . Lymph nodes enlarged 04/16/2016  . Hot flashes 10/30/2015  . Depression (emotion) 10/30/2015  . Streptococcal sore throat 09/12/2013  . Depression with anxiety 03/21/2013   Past Medical  History:  Diagnosis Date  . Depression   . Depression (emotion) 10/30/2015  . GAD (generalized anxiety disorder)   . Hot flashes 10/30/2015  . IUD (intrauterine device) in place 04/16/2016  . Lymph nodes enlarged 04/16/2016   Right under arm had CT and has seen Dr Rush Farmer   . Moody 10/30/2015  . Swollen lymph nodes    has had CT and saw Dr Rush Farmer    Family History  Problem Relation Age of Onset  . Cancer Mother        breast  . Breast cancer Mother   . Hypertension Father   . Cancer Maternal Grandmother        breast  . Breast cancer Maternal Grandmother   . Heart attack Maternal Grandfather   . Other Paternal Grandfather        died in MVA    Past Surgical History:  Procedure Laterality Date  . ABDOMINOPLASTY    . CESAREAN SECTION     x2   Social History   Occupational History  . Not on file  Tobacco Use  . Smoking status: Never Smoker  . Smokeless tobacco: Never Used  Substance and Sexual Activity  . Alcohol use: No  . Drug use: No  . Sexual activity: Yes    Birth control/protection: I.U.D.

## 2019-08-16 ENCOUNTER — Encounter: Payer: Self-pay | Admitting: Adult Health

## 2019-08-16 ENCOUNTER — Ambulatory Visit (INDEPENDENT_AMBULATORY_CARE_PROVIDER_SITE_OTHER): Payer: 59 | Admitting: Adult Health

## 2019-08-16 ENCOUNTER — Other Ambulatory Visit: Payer: Self-pay

## 2019-08-16 DIAGNOSIS — F411 Generalized anxiety disorder: Secondary | ICD-10-CM

## 2019-08-16 DIAGNOSIS — F331 Major depressive disorder, recurrent, moderate: Secondary | ICD-10-CM

## 2019-08-16 MED ORDER — FLUOXETINE HCL 20 MG PO CAPS: 20.0000 mg | ORAL_CAPSULE | Freq: Every day | ORAL | 1 refills | Status: DC

## 2019-08-16 MED FILL — FLUoxetine HCL 20 MG CAPS: 20 | 90 days supply | Qty: 90 | Fill #0

## 2019-08-16 NOTE — Progress Notes (Signed)
[facility] MD/PA/NP Initial Note  08/16/2019 12:00 PM Meghan Murray  MRN:  [id number]  Chief Complaint:  Chief Complaint    Depression; Anxiety      HPI:   Describes mood today as "so-so". Pleasant. Mood symptoms - reports depression - (feels numb) - feels nothing sometimes. Stating "I don't have any pleasure or joy in my life". Stating "it's been a hard year for everybody". Anxiety is situational - "marriage problems". Getting irritable easier than she normally would. She and husband having issues - working on marriage. Seeing individual and couples counseling. Step daughter died in 01/01/2007 - 5 y/o - had auto-immune hepatitis.Had a lot of physical symptoms after the loss and was tried on various medications before stabilizing on Lexapro. Feels like she needs a medication change. Stable interest and motivation. Taking medications as prescribed.  Energy levels good "testosterone helps a lot". Active, has a regular exercise routine - Crossfit 4 days a week. Works full-time.  Enjoys some usual interests and activities. Married x 29 years . Lives with husband. Daughter 55 and her 42 year old, and 37 year old son. Spending time with family. Mother local. Extended family local. Appetite adequate. Weight stable. Sleeps well most nights. Averages 8 to 9 hours. Focus and concentration difficulties at times. Completing tasks. Managing aspects of household. Work going well. 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  Denies SI or HI. Denies AH or VH. Seeing therapist.   Previous medications: Celexa, Effexor, Cymbalta, Wellbutrin, Lexapro  Visit Diagnosis:    ICD-10-CM   1. Major depressive disorder, recurrent episode, moderate (HCC)  F33.1 FLUoxetine (PROZAC) 20 MG capsule  2. Generalized anxiety disorder  F41.1 FLUoxetine (PROZAC) 20 MG capsule    Past Psychiatric History: Denies psychiatric hospitalizations.   Past Medical History:  Past Medical History:  Diagnosis Date  .  Depression   . Depression (emotion) 10/30/2015  . GAD (generalized anxiety disorder)   . Hot flashes 10/30/2015  . IUD (intrauterine device) in place 04/16/2016  . Lymph nodes enlarged 04/16/2016   Right under arm had CT and has seen Dr Rush Farmer   . Moody 10/30/2015  . Swollen lymph nodes    has had CT and saw Dr Rush Farmer    Past Surgical History:  Procedure Laterality Date  . ABDOMINOPLASTY    . CESAREAN SECTION     x2    Family Psychiatric History: Mother with depression.   Family History:  Family History  Problem Relation Age of Onset  . Cancer Mother        breast  . Breast cancer Mother   . Hypertension Father   . Cancer Maternal Grandmother        breast  . Breast cancer Maternal Grandmother   . Heart attack Maternal Grandfather   . Other Paternal Grandfather        died in MVA    Social History:  Social History   Socioeconomic History  . Marital status: Married    Spouse name: Not on file  . Number of children: Not on file  . Years of education: Not on file  . Highest education level: Not on file  Occupational History  . Not on file  Social Needs  . Financial resource strain: Not on file  . Food insecurity    Worry: Not on file    Inability: Not on file  . Transportation needs    Medical: Not on file    Non-medical: Not on  file  Tobacco Use  . Smoking status: Never Smoker  . Smokeless tobacco: Never Used  Substance and Sexual Activity  . Alcohol use: No  . Drug use: No  . Sexual activity: Yes    Birth control/protection: I.U.D.  Lifestyle  . Physical activity    Days per week: Not on file    Minutes per session: Not on file  . Stress: Not on file  Relationships  . Social Herbalist on phone: Not on file    Gets together: Not on file    Attends religious service: Not on file    Active member of club or organization: Not on file    Attends meetings of clubs or organizations: Not on file    Relationship status: Not on file  Other  Topics Concern  . Not on file  Social History Narrative  . Not on file    Allergies:  Allergies  Allergen Reactions  . Flagyl [Metronidazole]   . Sulfa Antibiotics     Metabolic Disorder Labs: No results found for: HGBA1C, MPG No results found for: PROLACTIN Lab Results  Component Value Date   CHOL 187 03/21/2013   TRIG 59 03/21/2013   HDL 77 03/21/2013   CHOLHDL 2.4 03/21/2013   VLDL 12 03/21/2013   LDLCALC 98 03/21/2013   Lab Results  Component Value Date   TSH 0.922 03/21/2013    Therapeutic Level Labs: No results found for: LITHIUM No results found for: VALPROATE No components found for:  CBMZ  Current Medications: Current Outpatient Medications  Medication Sig Dispense Refill  . escitalopram (LEXAPRO) 10 MG tablet TAKE ONE (1) TABLET EACH DAY 90 tablet 0  . FLUoxetine (PROZAC) 20 MG capsule Take 1 capsule (20 mg total) by mouth daily. 90 capsule 1  . IRON PO Take 50 mg by mouth daily.    Marland Kitchen levonorgestrel (MIRENA) 20 MCG/24HR IUD 1 each by Intrauterine route once.    Marland Kitchen VITAMIN D PO Take by mouth daily.     No current facility-administered medications for this visit.     Medication Side Effects: none  Orders placed this visit:  No orders of the defined types were placed in this encounter.   Psychiatric Specialty Exam:  Review of Systems  Musculoskeletal: Negative for falls.  Psychiatric/Behavioral: Positive for depression. The patient is nervous/anxious.     There were no vitals taken for this visit.There is no height or weight on file to calculate BMI.  General Appearance: Neat and Well Groomed  Eye Contact:  Good  Speech:  Clear and Coherent  Volume:  Normal  Mood:  Anxious and Depressed  Affect:  Congruent  Thought Process:  Coherent and Descriptions of Associations: Intact  Orientation:  Full (Time, Place, and Person)  Thought Content: Logical   Suicidal Thoughts:  No  Homicidal Thoughts:  No  Memory:  WNL  Judgement:  Good  Insight:   Good  Psychomotor Activity:  Normal  Concentration:  Concentration: Good  Recall:  Good  Fund of Knowledge: Good  Language: Good  Assets:  Communication Skills Desire for Improvement Financial Resources/Insurance Housing Intimacy Leisure Time Physical Health Resilience Social Support Talents/Skills Transportation Vocational/Educational  ADL's:  Intact  Cognition: WNL  Prognosis:  Good   Screenings:  PHQ2-9     Office Visit from 11/08/2014 in [facility]  PHQ-2 Total Score  0      Receiving Psychotherapy: Yes   Treatment Plan/Recommendations:  Plan:  1. Lexapro 10mg  -  1/2 daily x 7, then d/c.  2. Add Prozac 20mg  daily  RTC 4 weeks  Patient advised to contact office with any questions, adverse effects, or acute worsening in signs and symptoms.    Aloha Gell, NP

## 2019-08-17 ENCOUNTER — Ambulatory Visit: Payer: 59 | Admitting: Orthopaedic Surgery

## 2019-08-22 ENCOUNTER — Ambulatory Visit
Admission: RE | Admit: 2019-08-22 | Discharge: 2019-08-22 | Disposition: A | Discharge status: Home / Self Care | Payer: 59 | Source: Ambulatory Visit | Attending: Physician Assistant | Admitting: Physician Assistant

## 2019-08-22 ENCOUNTER — Other Ambulatory Visit: Payer: Self-pay

## 2019-08-22 DIAGNOSIS — Z1231 Encounter for screening mammogram for malignant neoplasm of breast: Secondary | ICD-10-CM

## 2019-09-13 ENCOUNTER — Encounter: Payer: Self-pay | Admitting: Adult Health

## 2019-09-13 ENCOUNTER — Ambulatory Visit (INDEPENDENT_AMBULATORY_CARE_PROVIDER_SITE_OTHER): Payer: 59 | Admitting: Adult Health

## 2019-09-13 DIAGNOSIS — F411 Generalized anxiety disorder: Secondary | ICD-10-CM | POA: Diagnosis not present

## 2019-09-13 DIAGNOSIS — F331 Major depressive disorder, recurrent, moderate: Secondary | ICD-10-CM

## 2019-09-13 NOTE — Progress Notes (Signed)
Meghan Murray [id number] July 07, 1971 48 y.o.  Virtual Visit via Telephone Note  I connected with pt on 09/13/19 at 11:00 AM EST by telephone and verified that I am speaking with the correct person using two identifiers.   I discussed the limitations, risks, security and privacy concerns of performing an evaluation and management service by telephone and the availability of in person appointments. I also discussed with the patient that there may be a patient responsible charge related to this service. The patient expressed understanding and agreed to proceed.   I discussed the assessment and treatment plan with the patient. The patient was provided an opportunity to ask questions and all were answered. The patient agreed with the plan and demonstrated an understanding of the instructions.   The patient was advised to call back or seek an in-person evaluation if the symptoms worsen or if the condition fails to improve as anticipated.  I provided 30 minutes of non-face-to-face time during this encounter.  The patient was located at home.  The provider was located at [place].   Aloha Gell, NP   Subjective:   Patient ID:  Meghan Murray is a 48 y.o. (DOB March 31, 1971) female.  Chief Complaint: No chief complaint on file.   HPI Meghan Murray presents for follow-up of GAD and MDD.  Describes mood today as "better". Pleasant. Mood symptoms - reports decreased depression, anxiety, and irritability.  Had a two week "transition" when switching medications. Feels like things have gotten better. Not feeling as "numb". Feels "more calm and not so tore up all the time". Stating "I feel like myself again". Working 3 days a week - medication has made a big difference for me at work. Able to "let things go" easier. Not getting upset like she was. Improved interest and motivation. Taking medications as prescribed.  Energy levels good. Active, has a regular exercise routine -  Crossfit 4 days a week. Works full-time.  Enjoys some usual interests and activities. Married. Lives with husband of 41 years, daughter 3 and her 61 year old, and 48 year old son. Mother local.  Appetite adequate. Weight stable. Sleeps well most nights. Averages 8 to 9 hours. Focus and concentration improved. Has started reading again. Managing aspects of household. Work going well. 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  Denies SI or HI. Denies AH or VH. Seeing therapist.   Previous medications: Celexa, Effexor, Cymbalta, Wellbutrin, Lexapro  Review of Systems:  Review of Systems  Musculoskeletal: Negative for gait problem.  Neurological: Negative for tremors.  Psychiatric/Behavioral:       Please refer to HPI    Medications: I have reviewed the patient's current medications.  Current Outpatient Medications  Medication Sig Dispense Refill  . escitalopram (LEXAPRO) 10 MG tablet TAKE ONE (1) TABLET EACH DAY 90 tablet 0  . FLUoxetine (PROZAC) 20 MG capsule Take 1 capsule (20 mg total) by mouth daily. 90 capsule 1  . IRON PO Take 50 mg by mouth daily.    Marland Kitchen levonorgestrel (MIRENA) 20 MCG/24HR IUD 1 each by Intrauterine route once.    Marland Kitchen VITAMIN D PO Take by mouth daily.     No current facility-administered medications for this visit.    Medication Side Effects: None  Allergies:  Allergies  Allergen Reactions  . Flagyl [Metronidazole]   . Sulfa Antibiotics     Past Medical History:  Diagnosis Date  . Depression   . Depression (emotion) 10/30/2015  .  GAD (generalized anxiety disorder)   . Hot flashes 10/30/2015  . IUD (intrauterine device) in place 04/16/2016  . Lymph nodes enlarged 04/16/2016   Right under arm had CT and has seen Dr Rush Farmer   . Moody 10/30/2015  . Swollen lymph nodes    has had CT and saw Dr Rush Farmer    Family History  Problem Relation Age of Onset  . Cancer Mother        breast  . Breast cancer Mother   . Hypertension Father   .  Cancer Maternal Grandmother        breast  . Breast cancer Maternal Grandmother   . Heart attack Maternal Grandfather   . Other Paternal Grandfather        died in [place] History   Socioeconomic History  . Marital status: Married    Spouse name: Not on file  . Number of children: Not on file  . Years of education: Not on file  . Highest education level: Not on file  Occupational History  . Not on file  Tobacco Use  . Smoking status: Never Smoker  . Smokeless tobacco: Never Used  Substance and Sexual Activity  . Alcohol use: No  . Drug use: No  . Sexual activity: Yes    Birth control/protection: I.U.D.  Other Topics Concern  . Not on file  Social History Narrative  . Not on file   Social Determinants of Health   Financial Resource Strain:   . Difficulty of Paying Living Expenses: Not on file  Food Insecurity:   . Worried About Charity fundraiser in the Last Year: Not on file  . Ran Out of Food in the Last Year: Not on file  Transportation Needs:   . Lack of Transportation (Medical): Not on file  . Lack of Transportation (Non-Medical): Not on file  Physical Activity:   . Days of Exercise per Week: Not on file  . Minutes of Exercise per Session: Not on file  Stress:   . Feeling of Stress : Not on file  Social Connections:   . Frequency of Communication with Friends and Family: Not on file  . Frequency of Social Gatherings with Friends and Family: Not on file  . Attends Religious Services: Not on file  . Active Member of Clubs or Organizations: Not on file  . Attends Archivist Meetings: Not on file  . Marital Status: Not on file  Intimate Partner Violence:   . Fear of Current or Ex-Partner: Not on file  . Emotionally Abused: Not on file  . Physically Abused: Not on file  . Sexually Abused: Not on file    Past Medical History, Surgical history, Social history, and Family history were reviewed and updated as appropriate.   Please see review  of systems for further details on the patient's review from today.   Objective:   Physical Exam:  There were no vitals taken for this visit.  Physical Exam Neurological:     Mental Status: She is alert and oriented to person, place, and time.     Cranial Nerves: No dysarthria.  Psychiatric:        Attention and Perception: Attention and perception normal.        Mood and Affect: Mood normal.        Speech: Speech normal.        Behavior: Behavior is cooperative.        Thought Content: Thought content normal.  Thought content is not paranoid or delusional. Thought content does not include homicidal or suicidal ideation. Thought content does not include homicidal or suicidal plan.        Cognition and Memory: Cognition and memory normal.        Judgment: Judgment normal.     Comments: Insight intact     Lab Review:     Component Value Date/Time   NA 139 03/21/2013 1012   K 4.1 03/21/2013 1012   CL 105 03/21/2013 1012   CO2 27 03/21/2013 1012   GLUCOSE 91 03/21/2013 1012   BUN 15 03/21/2013 1012   CREATININE 0.74 03/21/2013 1012   CALCIUM 9.6 03/21/2013 1012   PROT 7.3 03/21/2013 1012   ALBUMIN 4.8 03/21/2013 1012   AST 20 03/21/2013 1012   ALT 18 03/21/2013 1012   ALKPHOS 59 03/21/2013 1012   BILITOT 0.6 03/21/2013 1012   GFRNONAA >60 05/01/2007 2146   GFRAA  05/01/2007 2146    >60        The eGFR has been calculated using the MDRD equation. This calculation has not been validated in all clinical       Component Value Date/Time   WBC 5.2 03/21/2013 1012   RBC 4.02 03/21/2013 1012   HGB 12.4 03/21/2013 1012   HCT 36.1 03/21/2013 1012   PLT 293 03/21/2013 1012   MCV 89.8 03/21/2013 1012   MCH 30.8 03/21/2013 1012   MCHC 34.3 03/21/2013 1012   RDW 13.0 03/21/2013 1012   LYMPHSABS 2.1 03/14/2013 1656   MONOABS 0.3 03/14/2013 1656   EOSABS 0.1 03/14/2013 1656   BASOSABS 0.0 03/14/2013 1656    No results found for: POCLITH, LITHIUM   No results found for:  PHENYTOIN, PHENOBARB, VALPROATE, CBMZ   .res Assessment: Plan:    Plan:  1. Continue Prozac 59m daily  RTC 4 weeks  Patient advised to contact office with any questions, adverse effects, or acute worsening in signs and symptoms.  There are no diagnoses linked to this encounter.  Please see After Visit Summary for patient specific instructions.  Future Appointments  Date Time [place] 09/13/2019 11:00 AM Olivia Royse, RBerdie Ogren NP CP-CP None    No orders of the defined types were placed in this encounter.     -------------------------------

## 2019-09-18 DIAGNOSIS — Z6824 Body mass index (BMI) 24.0-24.9, adult: Secondary | ICD-10-CM | POA: Diagnosis not present

## 2019-09-18 DIAGNOSIS — R300 Dysuria: Secondary | ICD-10-CM | POA: Diagnosis not present

## 2019-09-21 ENCOUNTER — Ambulatory Visit: Payer: 59 | Admitting: Advanced Practice Midwife

## 2019-09-21 ENCOUNTER — Other Ambulatory Visit: Payer: Self-pay

## 2019-09-21 ENCOUNTER — Encounter: Payer: Self-pay | Admitting: Advanced Practice Midwife

## 2019-09-21 VITALS — BP 106/54 | HR 58 | Ht 61.0 in | Wt 132.0 lb

## 2019-09-21 DIAGNOSIS — N719 Inflammatory disease of uterus, unspecified: Secondary | ICD-10-CM | POA: Diagnosis not present

## 2019-09-21 MED ORDER — DOXYCYCLINE HYCLATE 100 MG PO CAPS: 100.0000 mg | ORAL_CAPSULE | Freq: Two times a day (BID) | ORAL | 0 refills | Status: DC

## 2019-09-21 NOTE — Progress Notes (Signed)
[facility] Visit  Patient name: Meghan Murray MRN [id number]  Date of birth: August 12, 1971  CC & HPI:  Meghan Murray is a 49 y.o. Caucasian female presenting today for lower pelvic pain/pressure since Monday. No real dysuria sx.  She saw her PCP ([facility]) Monday and was given Cipro for presumptive UTI.  Pt can't remember specific culture results, but "got the impression that they weren't too impressed" (contamination?)  Not feeling any better at all despite 4 doses of Cipro.  Back in March, she was dx w/endometritis of ? Etiology (pain/WBCs) which resolved w/doxycycline.  IUD was taken out (it had been in 5+ year) and replaced 2 weeks after resolution of endometritis symptoms. This feels very similar    Pertinent History Reviewed:  Medical & Surgical Hx:   Past Medical History:  Diagnosis Date  . Depression   . Depression (emotion) 10/30/2015  . GAD (generalized anxiety disorder)   . Hot flashes 10/30/2015  . IUD (intrauterine device) in place 04/16/2016  . Lymph nodes enlarged 04/16/2016   Right under arm had CT and has seen Dr Rush Farmer   . Moody 10/30/2015  . Swollen lymph nodes    has had CT and saw Dr Rush Farmer   Past Surgical History:  Procedure Laterality Date  . ABDOMINOPLASTY    . CESAREAN SECTION     x2   Family History  Problem Relation Age of Onset  . Cancer Mother        breast  . Breast cancer Mother   . Hypertension Father   . Cancer Maternal Grandmother        breast  . Breast cancer Maternal Grandmother   . Heart attack Maternal Grandfather   . Other Paternal Grandfather        died in MVA    Current Outpatient Medications:  .  FLUoxetine (PROZAC) 20 MG capsule, Take 1 capsule (20 mg total) by mouth daily., Disp: 90 capsule, Rfl: 1 .  levonorgestrel (MIRENA) 20 MCG/24HR IUD, 1 each by Intrauterine route once., Disp: , Rfl:  .  doxycycline (VIBRAMYCIN) 100 MG capsule, Take 1 capsule (100 mg total) by mouth 2 (two) times daily., Disp: 14 capsule,  Rfl: 0 .  IRON PO, Take 50 mg by mouth daily., Disp: , Rfl:  .  VITAMIN D PO, Take by mouth daily., Disp: , Rfl:  Social History: Reviewed -  reports that she has never smoked. She has never used smokeless tobacco.  Review of Systems:   Constitutional: Negative for fever and chills Eyes: Negative for visual disturbances Respiratory: Negative for shortness of breath, dyspnea Cardiovascular: Negative for chest pain or palpitations  Gastrointestinal: Negative for vomiting, diarrhea and constipation; no abdominal pain Genitourinary: Negative for dysuria and urgency, vaginal irritation or itching Musculoskeletal: Negative for back pain, joint pain, myalgias  Neurological: Negative for dizziness and headaches    Objective Findings:    Physical Examination: Vitals:   09/21/19 1345  BP: (!) 106/54  Pulse: (!) 58   General appearance - well appearing, and in no distress Mental status - alert, oriented to person, place, and time Chest:  Normal respiratory effort Heart - normal rate and regular rhythm Abdomen:  Soft, nontender Pelvic: yellow frothy DC.  + WBC, few clue, no trich or yeast.  + mild CMT, but main tenderness is to abdominal hand Musculoskeletal:  Normal range of motion without pain Extremities:  No edema    No results found for this or any previous visit (from  the past 24 hour(s)).    Assessment & Plan:  A:   endometritis P:  Discussed w/LHE>don't pull IUD>treat w/doxy (stop cipro)>NuSwab>f/u 2 weeks for repeat wet prep/exam> If no better in 1 week, let me know   Return in about 2 weeks (around 10/05/2019) for recheck gyn.  Joaquim Lai Cresenzo-Dishmon CNM 09/21/2019 2:09 PM

## 2019-09-25 LAB — NUSWAB VAGINITIS PLUS (VG+)
Candida albicans, NAA: NEGATIVE
Candida glabrata, NAA: NEGATIVE
Chlamydia trachomatis, NAA: NEGATIVE
Neisseria gonorrhoeae, NAA: NEGATIVE
Trich vag by NAA: NEGATIVE

## 2019-10-05 ENCOUNTER — Encounter: Payer: Self-pay | Admitting: Orthopaedic Surgery

## 2019-10-05 ENCOUNTER — Other Ambulatory Visit: Payer: Self-pay

## 2019-10-05 ENCOUNTER — Ambulatory Visit: Payer: 59 | Admitting: Adult Health

## 2019-10-05 ENCOUNTER — Ambulatory Visit (INDEPENDENT_AMBULATORY_CARE_PROVIDER_SITE_OTHER): Payer: 59 | Admitting: Orthopaedic Surgery

## 2019-10-05 ENCOUNTER — Encounter: Payer: Self-pay | Admitting: Adult Health

## 2019-10-05 VITALS — BP 101/43 | HR 61 | Ht 61.0 in | Wt 134.4 lb

## 2019-10-05 DIAGNOSIS — Z8742 Personal history of other diseases of the female genital tract: Secondary | ICD-10-CM | POA: Diagnosis not present

## 2019-10-05 DIAGNOSIS — N898 Other specified noninflammatory disorders of vagina: Secondary | ICD-10-CM

## 2019-10-05 DIAGNOSIS — Z975 Presence of (intrauterine) contraceptive device: Secondary | ICD-10-CM

## 2019-10-05 DIAGNOSIS — M7541 Impingement syndrome of right shoulder: Secondary | ICD-10-CM | POA: Diagnosis not present

## 2019-10-05 LAB — POCT WET PREP (WET MOUNT)
Clue Cells Wet Prep Whiff POC: NEGATIVE
Trichomonas Wet Prep HPF POC: ABSENT
WBC, Wet Prep HPF POC: POSITIVE

## 2019-10-05 MED ORDER — LIDOCAINE HCL 1 % IJ SOLN
0.5000 mL | INTRAMUSCULAR | Status: AC | PRN
  Administered 2019-10-05: .5 mL

## 2019-10-05 MED ORDER — METHYLPREDNISOLONE ACETATE 40 MG/ML IJ SUSP
40.0000 mg | INTRAMUSCULAR | Status: AC | PRN
  Administered 2019-10-05: 40 mg via INTRA_ARTICULAR

## 2019-10-05 MED ORDER — BUPIVACAINE HCL 0.25 % IJ SOLN
4.0000 mL | INTRAMUSCULAR | Status: AC | PRN
  Administered 2019-10-05: 4 mL via INTRA_ARTICULAR

## 2019-10-05 NOTE — Progress Notes (Signed)
Office Visit Note   Patient: Meghan Murray           Date of Birth: 1971/02/21           MRN: [id number] Visit Date: 10/05/2019              Requested by: Lavella Lemons, PA [place],  [place] [address] PCP: Lavella Lemons, [place]   Assessment & Plan: Visit Diagnoses:  1. Impingement syndrome of right shoulder     Plan: Patient has supraspinatus tendinopathy likely with positive impingement related to overhead lifting CrossFit activities push-ups etc.  Subacromial injection performed with good relief we discussed activity modification with more aerobic work bike and elliptical exercises.  We discussed function rotator cuff looked at model pictures on the wall discussed rotator cuff function with humeral head depression and rotation.  Pathophysiology discussed.  If she does not get relief with the injection she will call we can proceed with a rotator cuff evaluation by right shoulder MRI scan.  Biceps tendon is significantly improved after the injection.  Follow-Up Instructions: No follow-ups on file.   Orders:  No orders of the defined types were placed in this encounter.  No orders of the defined types were placed in this encounter.     Procedures: Large Joint Inj: R subacromial bursa on 10/05/2019 12:48 PM Indications: pain Details: 22 G 1.5 in needle  Arthrogram: No  Medications: 4 mL bupivacaine 0.25 %; 40 mg methylPREDNISolone acetate 40 MG/ML; 0.5 mL lidocaine 1 % Outcome: tolerated well, no immediate complications Procedure, treatment alternatives, risks and benefits explained, specific risks discussed. Consent was given by the patient. Immediately prior to procedure a time out was called to verify the correct patient, procedure, equipment, support staff and site/side marked as required. Patient was prepped and draped in the usual sterile fashion.       Clinical Data: No additional findings.   Subjective: Chief Complaint  Patient presents with  .  Right Shoulder - Pain    HPI 48 year old female returns she has been active in CrossFit with continued pain in her right shoulder.  Previous injection runner biceps tendon right shoulder 07/27/2019 gave her good relief for period of time and she has had some recurrence of shoulder pain but now it is laterally and radiates into the supraspinatus fossa.  A massage therapist is Dr. About dry needling.  She has some pain on the medial aspect the supraspinatus pain with resisted abduction demonstrates impingement maneuver and states this reproduces her pain.  She does not have any numbness or tingling in her fingers she denies any neck pain.  Onset of problems was after several months of cross fit.  Review of Systems 14 point system update unchanged from 07/27/2019 other than as mentioned in HPI.   Objective: Vital Signs: Ht 5\' 1"  (1.549 m)   Wt 133 lb (60.3 kg)   BMI 25.13 kg/m   Physical Exam Constitutional:      Appearance: She is well-developed.  HENT:     Head: Normocephalic.     Right Ear: External ear normal.     Left Ear: External ear normal.  Eyes:     Pupils: Pupils are equal, round, and reactive to light.  Neck:     Thyroid: No thyromegaly.     Trachea: No tracheal deviation.  Cardiovascular:     Rate and Rhythm: Normal rate.  Pulmonary:     Effort: Pulmonary effort is normal.  Abdominal:  Palpations: Abdomen is soft.  Skin:    General: Skin is warm and dry.  Neurological:     Mental Status: She is alert and oriented to person, place, and time.  Psychiatric:        Behavior: Behavior normal.     Ortho Exam patient has good muscle development.  Negative Spurling brachial plexus is nontender tenderness over the supraspinatus pain with resisted supraspinatus testing.  Subscap and external rotators are normal full passive range of motion of the shoulder negative Hawkins, positive Neer.  Negative Yergason.  Long of the biceps tendon is nontender today.  Specialty  Comments:  No specialty comments available.  Imaging: No results found.   PMFS History: Patient Active Problem List   Diagnosis Date Noted  . Impingement syndrome of right shoulder 10/05/2019  . Encounter for insertion of mirena IUD 04/16/2016  . Lymph nodes enlarged 04/16/2016  . Hot flashes 10/30/2015  . Depression (emotion) 10/30/2015  . Depression with anxiety 03/21/2013   Past Medical History:  Diagnosis Date  . Depression   . Depression (emotion) 10/30/2015  . GAD (generalized anxiety disorder)   . Hot flashes 10/30/2015  . IUD (intrauterine device) in place 04/16/2016  . Lymph nodes enlarged 04/16/2016   Right under arm had CT and has seen Dr Rush Farmer   . Moody 10/30/2015  . Swollen lymph nodes    has had CT and saw Dr Rush Farmer    Family History  Problem Relation Age of Onset  . Cancer Mother        breast  . Breast cancer Mother   . Hypertension Father   . Cancer Maternal Grandmother        breast  . Breast cancer Maternal Grandmother   . Heart attack Maternal Grandfather   . Other Paternal Grandfather        died in MVA    Past Surgical History:  Procedure Laterality Date  . ABDOMINOPLASTY    . CESAREAN SECTION     x2   Social History   Occupational History  . Not on file  Tobacco Use  . Smoking status: Never Smoker  . Smokeless tobacco: Never Used  Substance and Sexual Activity  . Alcohol use: No  . Drug use: No  . Sexual activity: Yes    Birth control/protection: I.U.D.

## 2019-10-05 NOTE — Progress Notes (Signed)
  Subjective:     Patient ID: Meghan Murray, female   DOB: 1971-06-15, 49 y.o.   MRN: [id number]  HPI Azaline is a 49 year old white female, married, G2P2 in for follow up of taking doxycyline for endometritis and is better, no pain.Nuswab negative 09/21/19. PCP is Aggie Hacker PA.  Review of Systems No pain now  Reviewed past medical,surgical, social and family history. Reviewed medications and allergies.     Objective:   Physical Exam BP (!) 101/43 (BP Location: Left Arm, Patient Position: Sitting, Cuff Size: Normal)   Pulse 61   Ht 5\' 1"  (1.549 m)   Wt 134 lb 6.4 oz (61 kg)   BMI 25.39 kg/m   Skin warm and dry.Pelvic: external genitalia is normal in appearance no lesions, vagina: white discharge without odor(she says she always has discharge),urethra has no lesions or masses noted, cervix:smooth and bulbous,+IUD string, no CMT uterus: normal size, shape and contour, minimal tender, no masses felt, adnexa: no masses or tenderness noted. Bladder is non tender and no masses felt. Wet prep:  +WBCs. Examination chaperoned by Rolena Infante LPN.     Assessment:     1. History of endometritis  2. IUD (intrauterine device) in place  3. Vaginal discharge    Plan:     Return in 4 weeks for pap and physical

## 2019-10-10 ENCOUNTER — Ambulatory Visit (INDEPENDENT_AMBULATORY_CARE_PROVIDER_SITE_OTHER): Payer: 59 | Admitting: Adult Health

## 2019-10-10 ENCOUNTER — Encounter: Payer: Self-pay | Admitting: Adult Health

## 2019-10-10 DIAGNOSIS — F411 Generalized anxiety disorder: Secondary | ICD-10-CM | POA: Diagnosis not present

## 2019-10-10 DIAGNOSIS — F331 Major depressive disorder, recurrent, moderate: Secondary | ICD-10-CM | POA: Diagnosis not present

## 2019-10-10 MED ORDER — FLUOXETINE HCL 10 MG PO CAPS: 10.0000 mg | ORAL_CAPSULE | Freq: Every day | ORAL | 1 refills | Status: DC

## 2019-10-10 NOTE — Progress Notes (Signed)
Meghan Murray [id number] Feb 23, 1971 49 y.o.  Virtual Visit via Telephone Note  I connected with pt on 10/10/19 at 11:20 AM EST by telephone and verified that I am speaking with the correct person using two identifiers.   I discussed the limitations, risks, security and privacy concerns of performing an evaluation and management service by telephone and the availability of in person appointments. I also discussed with the patient that there may be a patient responsible charge related to this service. The patient expressed understanding and agreed to proceed.   I discussed the assessment and treatment plan with the patient. The patient was provided an opportunity to ask questions and all were answered. The patient agreed with the plan and demonstrated an understanding of the instructions.   The patient was advised to call back or seek an in-person evaluation if the symptoms worsen or if the condition fails to improve as anticipated.  I provided 30 minutes of non-face-to-face time during this encounter.  The patient was located at home.  The provider was located at [place].   Aloha Gell, NP   Subjective:   Patient ID:  Meghan Murray is a 49 y.o. (DOB 02-Dec-1970) female.  Chief Complaint: No chief complaint on file.   HPI Meghan Murray presents for follow-up of GAD and MDD.  Describes mood today as "good". Pleasant. Mood symptoms - reports decreased depression, anxiety, and irritability. Stating "I'm less angry and tore up all the time". Feels "calmer" overall. Feels like the Prozac at 25m is a "little heavy at times". Stating "I am feeling better than I did". She and husband working on their marriage. Improved interest and motivation. Taking medications as prescribed.  Energy levels stable. Active, has a regular exercise routine - Crossfit 4 days a week. 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 Married. Lives with husband of  255years, daughter 261and her 663year old, and 273year old son. Mother local.  Appetite adequate. Weight stable. Sleeps well most nights. Averages 8 to 9 hours. Focus and concentration stable. Managing aspects of household. Work going well. 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  Denies SI or HI. Denies AH or VH. Seeing therapist.   Previous medications: Celexa, Effexor, Cymbalta, Wellbutrin, Lexapro  Review of Systems:  Review of Systems  Musculoskeletal: Negative for gait problem.  Neurological: Negative for tremors.  Psychiatric/Behavioral:       Please refer to HPI   Medications: I have reviewed the patient's current medications.  Current Outpatient Medications  Medication Sig Dispense Refill  . FLUoxetine (PROZAC) 20 MG capsule Take 1 capsule (20 mg total) by mouth daily. 90 capsule 1  . IRON PO Take 50 mg by mouth daily.    .Marland Kitchenlevonorgestrel (MIRENA) 20 MCG/24HR IUD 1 each by Intrauterine route once.    .Marland KitchenVITAMIN D PO Take by mouth daily.     No current facility-administered medications for this visit.    Medication Side Effects: None  Allergies:  Allergies  Allergen Reactions  . Flagyl [Metronidazole]   . Sulfa Antibiotics     Past Medical History:  Diagnosis Date  . Depression   . Depression (emotion) 10/30/2015  . GAD (generalized anxiety disorder)   . Hot flashes 10/30/2015  . IUD (intrauterine device) in place 04/16/2016  . Lymph nodes enlarged 04/16/2016   Right under arm had CT and has seen Dr BRush Farmer  . Moody 10/30/2015  .  Swollen lymph nodes    has had CT and saw Dr Rush Farmer    Family History  Problem Relation Age of Onset  . Cancer Mother        breast  . Breast cancer Mother   . Hypertension Father   . Cancer Maternal Grandmother        breast  . Breast cancer Maternal Grandmother   . Heart attack Maternal Grandfather   . Other Paternal Grandfather        died in [place] History   Socioeconomic History  .  Marital status: Married    Spouse name: Not on file  . Number of children: Not on file  . Years of education: Not on file  . Highest education level: Not on file  Occupational History  . Not on file  Tobacco Use  . Smoking status: Never Smoker  . Smokeless tobacco: Never Used  Substance and Sexual Activity  . Alcohol use: No  . Drug use: No  . Sexual activity: Yes    Birth control/protection: I.U.D.  Other Topics Concern  . Not on file  Social History Narrative  . Not on file   Social Determinants of Health   Financial Resource Strain:   . Difficulty of Paying Living Expenses: Not on file  Food Insecurity:   . Worried About Charity fundraiser in the Last Year: Not on file  . Ran Out of Food in the Last Year: Not on file  Transportation Needs:   . Lack of Transportation (Medical): Not on file  . Lack of Transportation (Non-Medical): Not on file  Physical Activity:   . Days of Exercise per Week: Not on file  . Minutes of Exercise per Session: Not on file  Stress:   . Feeling of Stress : Not on file  Social Connections:   . Frequency of Communication with Friends and Family: Not on file  . Frequency of Social Gatherings with Friends and Family: Not on file  . Attends Religious Services: Not on file  . Active Member of Clubs or Organizations: Not on file  . Attends Archivist Meetings: Not on file  . Marital Status: Not on file  Intimate Partner Violence:   . Fear of Current or Ex-Partner: Not on file  . Emotionally Abused: Not on file  . Physically Abused: Not on file  . Sexually Abused: Not on file    Past Medical History, Surgical history, Social history, and Family history were reviewed and updated as appropriate.   Please see review of systems for further details on the patient's review from today.   Objective:   Physical Exam:  There were no vitals taken for this visit.  Physical Exam Constitutional:      General: She is not in acute  distress.    Appearance: She is well-developed.  Musculoskeletal:        General: No deformity.  Neurological:     Mental Status: She is alert and oriented to person, place, and time.     Coordination: Coordination normal.  Psychiatric:        Attention and Perception: Attention and perception normal. She does not perceive auditory or visual hallucinations.        Mood and Affect: Mood normal. Mood is not anxious or depressed. Affect is not labile, blunt, angry or inappropriate.        Speech: Speech normal.        Behavior: Behavior normal.  Thought Content: Thought content normal. Thought content is not paranoid or delusional. Thought content does not include homicidal or suicidal ideation. Thought content does not include homicidal or suicidal plan.        Cognition and Memory: Cognition and memory normal.        Judgment: Judgment normal.     Comments: Insight intact     Lab Review:     Component Value Date/Time   NA 139 03/21/2013 1012   K 4.1 03/21/2013 1012   CL 105 03/21/2013 1012   CO2 27 03/21/2013 1012   GLUCOSE 91 03/21/2013 1012   BUN 15 03/21/2013 1012   CREATININE 0.74 03/21/2013 1012   CALCIUM 9.6 03/21/2013 1012   PROT 7.3 03/21/2013 1012   ALBUMIN 4.8 03/21/2013 1012   AST 20 03/21/2013 1012   ALT 18 03/21/2013 1012   ALKPHOS 59 03/21/2013 1012   BILITOT 0.6 03/21/2013 1012   GFRNONAA >60 05/01/2007 2146   GFRAA  05/01/2007 2146    >60        The eGFR has been calculated using the MDRD equation. This calculation has not been validated in all clinical       Component Value Date/Time   WBC 5.2 03/21/2013 1012   RBC 4.02 03/21/2013 1012   HGB 12.4 03/21/2013 1012   HCT 36.1 03/21/2013 1012   PLT 293 03/21/2013 1012   MCV 89.8 03/21/2013 1012   MCH 30.8 03/21/2013 1012   MCHC 34.3 03/21/2013 1012   RDW 13.0 03/21/2013 1012   LYMPHSABS 2.1 03/14/2013 1656   MONOABS 0.3 03/14/2013 1656   EOSABS 0.1 03/14/2013 1656   BASOSABS 0.0 03/14/2013  1656    No results found for: POCLITH, LITHIUM   No results found for: PHENYTOIN, PHENOBARB, VALPROATE, CBMZ   .res Assessment: Plan:    Plan:  1. Decrease Prozac 44m to 174mdaily  RTC 3 months  Patient advised to contact office with any questions, adverse effects, or acute worsening in signs and symptoms.  Diagnoses and all orders for this visit:  Major depressive disorder, recurrent episode, moderate (HCC)  Generalized anxiety disorder    Please see After Visit Summary for patient specific instructions.  Future Appointments  Date Time Provider [place]2/18/2021  1:50 PM GrEstill DoomsNP [facility]    No orders of the defined types were placed in this encounter.     -------------------------------

## 2019-11-02 ENCOUNTER — Other Ambulatory Visit: Payer: 59 | Admitting: Adult Health

## 2019-11-15 ENCOUNTER — Other Ambulatory Visit: Payer: 59 | Admitting: Advanced Practice Midwife

## 2019-11-15 DIAGNOSIS — R232 Flushing: Secondary | ICD-10-CM | POA: Diagnosis not present

## 2019-11-15 DIAGNOSIS — N951 Menopausal and female climacteric states: Secondary | ICD-10-CM | POA: Diagnosis not present

## 2019-11-15 DIAGNOSIS — R5383 Other fatigue: Secondary | ICD-10-CM | POA: Diagnosis not present

## 2019-11-15 DIAGNOSIS — E039 Hypothyroidism, unspecified: Secondary | ICD-10-CM | POA: Diagnosis not present

## 2019-11-15 DIAGNOSIS — F39 Unspecified mood [affective] disorder: Secondary | ICD-10-CM | POA: Diagnosis not present

## 2019-11-22 DIAGNOSIS — R5383 Other fatigue: Secondary | ICD-10-CM | POA: Diagnosis not present

## 2019-11-22 DIAGNOSIS — R232 Flushing: Secondary | ICD-10-CM | POA: Diagnosis not present

## 2019-11-22 DIAGNOSIS — N951 Menopausal and female climacteric states: Secondary | ICD-10-CM | POA: Diagnosis not present

## 2019-11-27 ENCOUNTER — Ambulatory Visit (INDEPENDENT_AMBULATORY_CARE_PROVIDER_SITE_OTHER): Payer: 59 | Admitting: Women's Health

## 2019-11-27 ENCOUNTER — Encounter: Payer: Self-pay | Admitting: Women's Health

## 2019-11-27 ENCOUNTER — Other Ambulatory Visit: Payer: Self-pay

## 2019-11-27 ENCOUNTER — Other Ambulatory Visit (HOSPITAL_COMMUNITY)
Admission: RE | Admit: 2019-11-27 | Discharge: 2019-11-27 | Disposition: A | Discharge status: Home / Self Care | Payer: 59 | Source: Ambulatory Visit | Attending: Adult Health | Admitting: Adult Health

## 2019-11-27 VITALS — BP 116/68 | HR 59 | Ht 62.5 in | Wt 131.8 lb

## 2019-11-27 DIAGNOSIS — Z01419 Encounter for gynecological examination (general) (routine) without abnormal findings: Secondary | ICD-10-CM | POA: Diagnosis not present

## 2019-11-27 NOTE — Progress Notes (Signed)
   WELL-WOMAN EXAMINATION Patient name: Meghan Murray MRN [id number]  Date of birth: 11-02-1970 Chief Complaint:   Gynecologic Exam  History of Present Illness:   Meghan Murray is a 49 y.o. G66P2002 Caucasian female being seen today for a routine well-woman exam.  Current complaints: none  PCP: Marcelle Overlie      does not desire labs No LMP recorded. (Menstrual status: IUD). The current method of family planning is IUD, Mirena inserted 11/28/18  Last pap 04/16/16. Results were: neg w/ -HRHPV. H/O abnormal pap: Yes prior to children Last mammogram: 08/22/19. Results were: normal. Family h/o breast cancer: Yes, Mom in 85s, MGM in 78s, declines genetic testing Last colonoscopy: never. Results were: n/a. Family h/o colorectal cancer: No Review of Systems:   Pertinent items are noted in HPI Denies any headaches, blurred vision, fatigue, shortness of breath, chest pain, abdominal pain, abnormal vaginal discharge/itching/odor/irritation, problems with periods, bowel movements, urination, or intercourse unless otherwise stated above. Pertinent History Reviewed:  Reviewed past medical,surgical, social and family history.  Reviewed problem list, medications and allergies. Physical Assessment:   Vitals:   11/27/19 0846  BP: 116/68  Pulse: (!) 59  Weight: 131 lb 12.8 oz (59.8 kg)  Height: 5' 2.5" (1.588 m)  Body mass index is 23.72 kg/m.        Physical Examination:   General appearance - well appearing, and in no distress  Mental status - alert, oriented to person, place, and time  Psych:  She has a normal mood and affect  Skin - warm and dry, normal color, no suspicious lesions noted  Chest - effort normal, all lung fields clear to auscultation bilaterally  Heart - normal rate and regular rhythm  Neck:  midline trachea, no thyromegaly or nodules  Breasts - breasts appear normal, no suspicious masses, no skin or nipple changes or  axillary nodes  Abdomen - soft, nontender, nondistended,  no masses or organomegaly  Pelvic - VULVA: normal appearing vulva with no masses, tenderness or lesions  VAGINA: normal appearing vagina with normal color and discharge, no lesions  CERVIX: normal appearing cervix without discharge or lesions, no CMT, IUD strings not visible, pt declines TA u/s, confident it's there  Thin prep pap is done w/ HR HPV cotesting  UTERUS: uterus is felt to be normal size, shape, consistency and nontender   ADNEXA: No adnexal masses or tenderness noted.  Extremities:  No swelling or varicosities noted  Chaperone: Levy Pupa    No results found for this or any previous visit (from the past 24 hour(s)).  Assessment & Plan:  1) Well-Woman Exam  2) IUD strings not visible> amenorrheic, no problems  Labs/procedures today: pap  Mammogram Dec 2021 or sooner if problems Colonoscopy @50yo  or sooner if problems  No orders of the defined types were placed in this encounter.   Meds: No orders of the defined types were placed in this encounter.   Follow-up: Return in about 1 year (around 11/26/2020) for Physical.  Roma Schanz CNM, WHNP-BC 11/27/2019 9:15 AM

## 2019-11-29 LAB — CYTOLOGY - PAP
Comment: NEGATIVE
Diagnosis: NEGATIVE
High risk HPV: NEGATIVE

## 2020-04-03 ENCOUNTER — Telehealth: Payer: Self-pay | Admitting: Radiology

## 2020-04-03 DIAGNOSIS — M7541 Impingement syndrome of right shoulder: Secondary | ICD-10-CM

## 2020-04-03 NOTE — Telephone Encounter (Signed)
Proceed with MRI. ROV after scan thanks

## 2020-04-03 NOTE — Telephone Encounter (Signed)
Patient called [facility] office stating if her shoulder pain was not any better, you were going to order MRI. Your note states that would be the next step if no relief after the injection, however, we last saw her in the office in January. Would you like to proceed with MRI?

## 2020-04-04 NOTE — Telephone Encounter (Signed)
Order entered

## 2020-04-04 NOTE — Addendum Note (Signed)
Addended by: Meyer Cory on: 04/04/2020 09:17 AM   Modules accepted: Orders

## 2020-04-04 NOTE — Telephone Encounter (Signed)
I called patient and advised. Follow up appt scheduled for [place] office on 04/23/2020.

## 2020-04-08 DIAGNOSIS — N951 Menopausal and female climacteric states: Secondary | ICD-10-CM | POA: Diagnosis not present

## 2020-04-08 DIAGNOSIS — R5383 Other fatigue: Secondary | ICD-10-CM | POA: Diagnosis not present

## 2020-04-08 DIAGNOSIS — E039 Hypothyroidism, unspecified: Secondary | ICD-10-CM | POA: Diagnosis not present

## 2020-04-08 DIAGNOSIS — R232 Flushing: Secondary | ICD-10-CM | POA: Diagnosis not present

## 2020-04-15 DIAGNOSIS — N951 Menopausal and female climacteric states: Secondary | ICD-10-CM | POA: Diagnosis not present

## 2020-04-15 DIAGNOSIS — R6882 Decreased libido: Secondary | ICD-10-CM | POA: Diagnosis not present

## 2020-04-15 DIAGNOSIS — R232 Flushing: Secondary | ICD-10-CM | POA: Diagnosis not present

## 2020-04-15 DIAGNOSIS — F4322 Adjustment disorder with anxiety: Secondary | ICD-10-CM | POA: Diagnosis not present

## 2020-04-15 DIAGNOSIS — R5383 Other fatigue: Secondary | ICD-10-CM | POA: Diagnosis not present

## 2020-04-15 DIAGNOSIS — E039 Hypothyroidism, unspecified: Secondary | ICD-10-CM | POA: Diagnosis not present

## 2020-04-19 DIAGNOSIS — M75121 Complete rotator cuff tear or rupture of right shoulder, not specified as traumatic: Secondary | ICD-10-CM | POA: Diagnosis not present

## 2020-04-19 DIAGNOSIS — M19011 Primary osteoarthritis, right shoulder: Secondary | ICD-10-CM | POA: Diagnosis not present

## 2020-04-19 DIAGNOSIS — M7551 Bursitis of right shoulder: Secondary | ICD-10-CM | POA: Diagnosis not present

## 2020-04-19 DIAGNOSIS — M25511 Pain in right shoulder: Secondary | ICD-10-CM | POA: Diagnosis not present

## 2020-04-19 DIAGNOSIS — M25711 Osteophyte, right shoulder: Secondary | ICD-10-CM | POA: Diagnosis not present

## 2020-04-19 DIAGNOSIS — M67813 Other specified disorders of tendon, right shoulder: Secondary | ICD-10-CM | POA: Diagnosis not present

## 2020-04-22 DIAGNOSIS — F4322 Adjustment disorder with anxiety: Secondary | ICD-10-CM | POA: Diagnosis not present

## 2020-04-23 ENCOUNTER — Ambulatory Visit: Payer: 59 | Admitting: Orthopaedic Surgery

## 2020-04-23 ENCOUNTER — Encounter: Payer: Self-pay | Admitting: Orthopaedic Surgery

## 2020-04-23 DIAGNOSIS — M75121 Complete rotator cuff tear or rupture of right shoulder, not specified as traumatic: Secondary | ICD-10-CM | POA: Diagnosis not present

## 2020-04-23 NOTE — Progress Notes (Signed)
Office Visit Note   Patient: Meghan Murray           Date of Birth: November 10, 1970           MRN: [id number] Visit Date: 04/23/2020              Requested by: Lavella Lemons, PA [place],  [place] [address] PCP: Lavella Lemons, [place]   Assessment & Plan: Visit Diagnoses:  1. Complete tear of right rotator cuff, unspecified whether traumatic     Plan: We discussed options for treatment.  She like to proceed with shoulder arthroscopy and rotator cuff repair.  We discussed possible arthroscopic versus open procedure and with the retraction she has as well as shoulder demand she has had on her shoulders with push-ups handstands pull-ups I would recommend a mini open repair to make sure there.  Smoothened additionally reinforced.  Questions were elicited and answered she understands request to proceed.  Follow-Up Instructions: No follow-ups on file.   Orders:  No orders of the defined types were placed in this encounter.  No orders of the defined types were placed in this encounter.     Procedures: No procedures performed   Clinical Data: No additional findings.   Subjective: Chief Complaint  Patient presents with   Right Shoulder - Follow-up, Pain    MRI review    HPI 49 year old female returns with ongoing problems with pain in her right shoulder at times she rates it as excruciating.  She has reproduced pain with outstretch reaching overhead activities she has problems washing her hair getting dressed.  Problems reaching behind her as well as across her chest.  Patient has long history of activity with CrossFit including pull-ups push-ups etc.  She has had gradual increased symptoms despite activity modification with workouts.  MRI scan is available for review and shows full-thickness rotator cuff tear of the supraspinatus greater than 3 cm retracted but no atrophy.  She has been doing activity modification since back in November 2020 without relief.  She has had  previous injection anti-inflammatories intermittent ice and rest.  Review of Systems 14 point systems reviewed noncontributory to HPI.   Objective: Vital Signs: BP 116/72    Pulse 67    Ht 5\' 1"  (1.549 m)    Wt 131 lb (59.4 kg)    BMI 24.75 kg/m   Physical Exam Constitutional:      Appearance: She is well-developed.  HENT:     Head: Normocephalic.     Right Ear: External ear normal.     Left Ear: External ear normal.  Eyes:     Pupils: Pupils are equal, round, and reactive to light.  Neck:     Thyroid: No thyromegaly.     Trachea: No tracheal deviation.  Cardiovascular:     Rate and Rhythm: Normal rate.  Pulmonary:     Effort: Pulmonary effort is normal.  Abdominal:     Palpations: Abdomen is soft.  Skin:    General: Skin is warm and dry.  Neurological:     Mental Status: She is alert and oriented to person, place, and time.  Psychiatric:        Behavior: Behavior normal.     Ortho Exam patient has no periscapular atrophy excellent muscle development upper extremities good shoulders.  Positive drop arm test on the right negative on the left.  Internal and external rotation of the shoulder is strong some pain with crossarm positioning.  Negative  liftoff test.  Reflexes are 2+ sensation hand is intact no evidence of peripheral nerve entrapment.  Lower extremities are normal normal heel toe gait excellent lower extremity strength.  Specialty Comments:  No specialty comments available.  Imaging: Rotator cuff tendinopathy with a complete supraspinatus tendon tear  just medial to the tendon insertion. Gap between tendon fragments is  3-3.5 cm. No atrophy.   Mild acromioclavicular osteoarthritis and mild subacromial spurring.   Subacromial/subdeltoid bursitis.      PMFS History: Patient Active Problem List   Diagnosis Date Noted   Complete tear of right rotator cuff 04/29/2020   Impingement syndrome of right shoulder 10/05/2019   History of endometritis 10/05/2019    Encounter for insertion of mirena IUD 04/16/2016   Lymph nodes enlarged 04/16/2016   Hot flashes 10/30/2015   Depression (emotion) 10/30/2015   Depression with anxiety 03/21/2013   Past Medical History:  Diagnosis Date   Depression    Depression (emotion) 10/30/2015   GAD (generalized anxiety disorder)    Hot flashes 10/30/2015   IUD (intrauterine device) in place 04/16/2016   Lymph nodes enlarged 04/16/2016   Right under arm had CT and has seen Dr Jeanella Cara 10/30/2015   Swollen lymph nodes    has had CT and saw Dr Rush Farmer    Family History  Problem Relation Age of Onset   Cancer Mother        breast   Breast cancer Mother    Hypertension Father    Cancer Maternal Grandmother        breast   Breast cancer Maternal Grandmother    Heart attack Maternal Grandfather    Other Paternal Grandfather        died in MVA    Past Surgical History:  Procedure Laterality Date   ABDOMINOPLASTY     CESAREAN SECTION     x2   Social History   Occupational History   Not on file  Tobacco Use   Smoking status: Never Smoker   Smokeless tobacco: Never Used  Vaping Use   Vaping Use: Never used  Substance and Sexual Activity   Alcohol use: No   Drug use: No   Sexual activity: Yes    Birth control/protection: I.U.D.

## 2020-04-29 DIAGNOSIS — F4322 Adjustment disorder with anxiety: Secondary | ICD-10-CM | POA: Diagnosis not present

## 2020-04-29 DIAGNOSIS — M75121 Complete rotator cuff tear or rupture of right shoulder, not specified as traumatic: Secondary | ICD-10-CM | POA: Insufficient documentation

## 2020-05-06 DIAGNOSIS — F4322 Adjustment disorder with anxiety: Secondary | ICD-10-CM | POA: Diagnosis not present

## 2020-05-13 DIAGNOSIS — F4322 Adjustment disorder with anxiety: Secondary | ICD-10-CM | POA: Diagnosis not present

## 2020-06-03 ENCOUNTER — Telehealth: Payer: Self-pay | Admitting: Orthopaedic Surgery

## 2020-06-03 NOTE — Telephone Encounter (Signed)
[employer] forms received. Sent to [employer]. 

## 2020-06-06 ENCOUNTER — Telehealth: Payer: Self-pay | Admitting: Orthopaedic Surgery

## 2020-06-06 NOTE — Telephone Encounter (Signed)
Pt called stating she was notified that we faxed her FMLA paperwork back blank and she would like for [place] to fill and resend it please  [phone]

## 2020-06-06 NOTE — Telephone Encounter (Signed)
Ic,lmvm advised that we have not faxed any forms to [employer]. I advised we received forms on 9/20 and sent to [employer] to be completed. Advised she will need to sign auth and pay $25.00.

## 2020-06-06 NOTE — Telephone Encounter (Signed)
Can you check on this for me please? Looks like these forms were sent to [employer]. Thanks.

## 2020-06-28 ENCOUNTER — Other Ambulatory Visit: Payer: Self-pay

## 2020-06-28 ENCOUNTER — Encounter (HOSPITAL_BASED_OUTPATIENT_CLINIC_OR_DEPARTMENT_OTHER): Payer: Self-pay | Admitting: Orthopaedic Surgery

## 2020-07-01 ENCOUNTER — Ambulatory Visit (INDEPENDENT_AMBULATORY_CARE_PROVIDER_SITE_OTHER): Payer: 59 | Admitting: Psychiatry

## 2020-07-01 ENCOUNTER — Other Ambulatory Visit: Payer: Self-pay

## 2020-07-01 ENCOUNTER — Encounter: Payer: Self-pay | Admitting: Psychiatry

## 2020-07-01 DIAGNOSIS — F411 Generalized anxiety disorder: Secondary | ICD-10-CM
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
## 2020-07-04 ENCOUNTER — Other Ambulatory Visit (HOSPITAL_COMMUNITY)
Admission: RE | Admit: 2020-07-04 | Discharge: 2020-07-04 | Disposition: A | Discharge status: Home / Self Care | Payer: 59 | Source: Ambulatory Visit | Attending: Orthopaedic Surgery | Admitting: Orthopaedic Surgery

## 2020-07-04 DIAGNOSIS — Z20822 Contact with and (suspected) exposure to covid-19: Secondary | ICD-10-CM | POA: Insufficient documentation

## 2020-07-04 DIAGNOSIS — Z01812 Encounter for preprocedural laboratory examination: Secondary | ICD-10-CM | POA: Diagnosis not present

## 2020-07-04 LAB — SARS CORONAVIRUS 2 (TAT 6-24 HRS): SARS Coronavirus 2: NEGATIVE

## 2020-07-08 ENCOUNTER — Ambulatory Visit (HOSPITAL_BASED_OUTPATIENT_CLINIC_OR_DEPARTMENT_OTHER): Payer: 59 | Admitting: Certified Registered"

## 2020-07-08 ENCOUNTER — Encounter (HOSPITAL_BASED_OUTPATIENT_CLINIC_OR_DEPARTMENT_OTHER): Payer: Self-pay | Admitting: Orthopaedic Surgery

## 2020-07-08 ENCOUNTER — Encounter: Payer: Self-pay | Admitting: Orthopaedic Surgery

## 2020-07-08 ENCOUNTER — Ambulatory Visit (HOSPITAL_BASED_OUTPATIENT_CLINIC_OR_DEPARTMENT_OTHER)
Admission: RE | Admit: 2020-07-08 | Discharge: 2020-07-08 | Disposition: A | Discharge status: Home / Self Care | Payer: 59 | Attending: Orthopaedic Surgery | Admitting: Orthopaedic Surgery

## 2020-07-08 ENCOUNTER — Encounter (HOSPITAL_BASED_OUTPATIENT_CLINIC_OR_DEPARTMENT_OTHER)
Admission: RE | Disposition: A | Discharge status: Home / Self Care | Payer: Self-pay | Source: Home / Self Care | Attending: Orthopaedic Surgery

## 2020-07-08 DIAGNOSIS — M75121 Complete rotator cuff tear or rupture of right shoulder, not specified as traumatic: Secondary | ICD-10-CM | POA: Diagnosis present

## 2020-07-08 DIAGNOSIS — M7541 Impingement syndrome of right shoulder: Secondary | ICD-10-CM | POA: Diagnosis not present

## 2020-07-08 DIAGNOSIS — G8918 Other acute postprocedural pain: Secondary | ICD-10-CM | POA: Diagnosis not present

## 2020-07-08 DIAGNOSIS — Z5333 Arthroscopic surgical procedure converted to open procedure: Secondary | ICD-10-CM | POA: Diagnosis not present

## 2020-07-08 DIAGNOSIS — Z975 Presence of (intrauterine) contraceptive device: Secondary | ICD-10-CM | POA: Diagnosis not present

## 2020-07-08 HISTORY — DX: Other specified postprocedural states: R11.2

## 2020-07-08 HISTORY — PX: SHOULDER ARTHROSCOPY WITH OPEN ROTATOR CUFF REPAIR: SHX6092

## 2020-07-08 HISTORY — DX: Other specified postprocedural states: Z98.890

## 2020-07-08 LAB — POCT PREGNANCY, URINE: Preg Test, Ur: NEGATIVE

## 2020-07-08 SURGERY — ARTHROSCOPY, SHOULDER WITH REPAIR, ROTATOR CUFF, OPEN
Anesthesia: General | Site: Shoulder | Laterality: Right

## 2020-07-08 MED ORDER — DEXAMETHASONE SODIUM PHOSPHATE 10 MG/ML IJ SOLN
INTRAMUSCULAR | Status: AC
  Filled 2020-07-08: qty 1

## 2020-07-08 MED ORDER — PROMETHAZINE HCL 25 MG/ML IJ SOLN: 6.2500 mg | INTRAMUSCULAR | Status: DC | PRN

## 2020-07-08 MED ORDER — LACTATED RINGERS IV SOLN: INTRAVENOUS | Status: DC

## 2020-07-08 MED ORDER — SCOPOLAMINE 1 MG/3DAYS TD PT72
MEDICATED_PATCH | TRANSDERMAL | Status: AC
  Filled 2020-07-08: qty 1

## 2020-07-08 MED ORDER — LIDOCAINE 2% (20 MG/ML) 5 ML SYRINGE
INTRAMUSCULAR | Status: DC | PRN
  Administered 2020-07-08: 60 mg via INTRAVENOUS

## 2020-07-08 MED ORDER — FENTANYL CITRATE (PF) 100 MCG/2ML IJ SOLN
INTRAMUSCULAR | Status: AC
  Filled 2020-07-08: qty 2

## 2020-07-08 MED ORDER — MIDAZOLAM HCL 2 MG/2ML IJ SOLN
2.0000 mg | Freq: Once | INTRAMUSCULAR | Status: AC
  Administered 2020-07-08: 2 mg via INTRAVENOUS

## 2020-07-08 MED ORDER — CEFAZOLIN SODIUM-DEXTROSE 2-4 GM/100ML-% IV SOLN
2.0000 g | INTRAVENOUS | Status: AC
  Administered 2020-07-08: 2 g via INTRAVENOUS

## 2020-07-08 MED ORDER — MIDAZOLAM HCL 2 MG/2ML IJ SOLN
INTRAMUSCULAR | Status: AC
  Filled 2020-07-08: qty 2

## 2020-07-08 MED ORDER — EPHEDRINE SULFATE-NACL 50-0.9 MG/10ML-% IV SOSY
PREFILLED_SYRINGE | INTRAVENOUS | Status: DC | PRN
  Administered 2020-07-08: 15 mg via INTRAVENOUS

## 2020-07-08 MED ORDER — SODIUM CHLORIDE 0.9 % IR SOLN
Status: DC | PRN
  Administered 2020-07-08: 1

## 2020-07-08 MED ORDER — HYDROMORPHONE HCL 1 MG/ML IJ SOLN: 0.2500 mg | INTRAMUSCULAR | Status: DC | PRN

## 2020-07-08 MED ORDER — ONDANSETRON HCL 4 MG/2ML IJ SOLN
INTRAMUSCULAR | Status: AC
  Filled 2020-07-08: qty 2

## 2020-07-08 MED ORDER — CEFAZOLIN SODIUM-DEXTROSE 2-4 GM/100ML-% IV SOLN
INTRAVENOUS | Status: AC
  Filled 2020-07-08: qty 100

## 2020-07-08 MED ORDER — PROPOFOL 10 MG/ML IV BOLUS
INTRAVENOUS | Status: AC
  Filled 2020-07-08: qty 20

## 2020-07-08 MED ORDER — MEPERIDINE HCL 25 MG/ML IJ SOLN: 6.2500 mg | INTRAMUSCULAR | Status: DC | PRN

## 2020-07-08 MED ORDER — BUPIVACAINE HCL (PF) 0.5 % IJ SOLN
INTRAMUSCULAR | Status: AC
  Filled 2020-07-08: qty 30

## 2020-07-08 MED ORDER — OXYCODONE HCL 5 MG/5ML PO SOLN: 5.0000 mg | Freq: Once | ORAL | Status: DC | PRN

## 2020-07-08 MED ORDER — ONDANSETRON HCL 4 MG/2ML IJ SOLN
INTRAMUSCULAR | Status: DC | PRN
  Administered 2020-07-08: 4 mg via INTRAVENOUS

## 2020-07-08 MED ORDER — OXYCODONE-ACETAMINOPHEN 5-325 MG PO TABS
1.0000 | ORAL_TABLET | ORAL | 0 refills | Status: DC | PRN
Start: 2020-07-08 — End: 2021-01-20

## 2020-07-08 MED ORDER — OXYCODONE HCL 5 MG PO TABS: 5.0000 mg | ORAL_TABLET | Freq: Once | ORAL | Status: DC | PRN

## 2020-07-08 MED ORDER — BUPIVACAINE HCL (PF) 0.5 % IJ SOLN
INTRAMUSCULAR | Status: DC | PRN
  Administered 2020-07-08: 20 mL via PERINEURAL

## 2020-07-08 MED ORDER — PROPOFOL 10 MG/ML IV BOLUS
INTRAVENOUS | Status: DC | PRN
  Administered 2020-07-08: 200 mg via INTRAVENOUS

## 2020-07-08 MED ORDER — BUPIVACAINE HCL (PF) 0.5 % IJ SOLN
INTRAMUSCULAR | Status: DC | PRN
  Administered 2020-07-08: 5 mL

## 2020-07-08 MED ORDER — FENTANYL CITRATE (PF) 100 MCG/2ML IJ SOLN
100.0000 ug | Freq: Once | INTRAMUSCULAR | Status: AC
  Administered 2020-07-08: 100 ug via INTRAVENOUS

## 2020-07-08 MED ORDER — BUPIVACAINE LIPOSOME 1.3 % IJ SUSP
INTRAMUSCULAR | Status: DC | PRN
  Administered 2020-07-08: 10 mL via PERINEURAL

## 2020-07-08 MED ORDER — DEXAMETHASONE SODIUM PHOSPHATE 10 MG/ML IJ SOLN
INTRAMUSCULAR | Status: DC | PRN
  Administered 2020-07-08: 5 mg via INTRAVENOUS

## 2020-07-08 MED ORDER — METHOCARBAMOL 500 MG PO TABS: 500.0000 mg | ORAL_TABLET | Freq: Three times a day (TID) | ORAL | 0 refills | Status: DC | PRN

## 2020-07-08 MED ORDER — SCOPOLAMINE 1 MG/3DAYS TD PT72
1.0000 | MEDICATED_PATCH | TRANSDERMAL | Status: DC
  Administered 2020-07-08: 1.5 mg via TRANSDERMAL

## 2020-07-08 SURGICAL SUPPLY — 67 items
AID PSTN UNV HD RSTRNT DISP (MISCELLANEOUS) ×1
ANCH SUT FBRTK 1.3 2 TPE (Anchor) ×1 IMPLANT
ANCHOR FBRTK 2.6 SUTURETAP 1.3 (Anchor) ×1 IMPLANT
APL SKNCLS STERI-STRIP NONHPOA (GAUZE/BANDAGES/DRESSINGS) ×1
BENZOIN TINCTURE PRP APPL 2/3 (GAUZE/BANDAGES/DRESSINGS) ×2 IMPLANT
BLADE SURG 15 STRL LF DISP TIS (BLADE) IMPLANT
BLADE SURG 15 STRL SS (BLADE)
BURR OVAL 8 FLU 5.0X13 (MISCELLANEOUS) IMPLANT
CANNULA ACUFLEX KIT 5X76 (CANNULA) ×2 IMPLANT
CLEANER CAUTERY TIP 5X5 PAD (MISCELLANEOUS) IMPLANT
COVER WAND RF STERILE (DRAPES) IMPLANT
DECANTER SPIKE VIAL GLASS SM (MISCELLANEOUS) IMPLANT
DRAPE STERI 35X30 U-POUCH (DRAPES) ×2 IMPLANT
DRAPE U-SHAPE 47X51 STRL (DRAPES) ×2 IMPLANT
DRAPE U-SHAPE 76X120 STRL (DRAPES) ×4 IMPLANT
DRSG EMULSION OIL 3X3 NADH (GAUZE/BANDAGES/DRESSINGS) ×2 IMPLANT
DRSG PAD ABDOMINAL 8X10 ST (GAUZE/BANDAGES/DRESSINGS) ×2 IMPLANT
DURAPREP 26ML APPLICATOR (WOUND CARE) ×2 IMPLANT
ELECT MENISCUS 165MM 90D (ELECTRODE) IMPLANT
ELECT REM PT RETURN 9FT ADLT (ELECTROSURGICAL) ×2
ELECTRODE REM PT RTRN 9FT ADLT (ELECTROSURGICAL) ×1 IMPLANT
GAUZE SPONGE 4X4 12PLY STRL (GAUZE/BANDAGES/DRESSINGS) ×2 IMPLANT
GLOVE BIO SURGEON STRL SZ7.5 (GLOVE) ×3 IMPLANT
GLOVE BIOGEL M 6.5 STRL (GLOVE) ×2 IMPLANT
GLOVE BIOGEL PI IND STRL 8 (GLOVE) ×1 IMPLANT
GLOVE BIOGEL PI INDICATOR 8 (GLOVE) ×1
GLOVE NEODERM STER SZ 7 (GLOVE) ×1 IMPLANT
GOWN STRL REUS W/ TWL LRG LVL3 (GOWN DISPOSABLE) ×1 IMPLANT
GOWN STRL REUS W/ TWL XL LVL3 (GOWN DISPOSABLE) ×1 IMPLANT
GOWN STRL REUS W/TWL LRG LVL3 (GOWN DISPOSABLE) ×2
GOWN STRL REUS W/TWL XL LVL3 (GOWN DISPOSABLE) ×2
MANIFOLD NEPTUNE II (INSTRUMENTS) ×2 IMPLANT
NDL SUT 6 .5 CRC .975X.05 MAYO (NEEDLE) IMPLANT
NEEDLE MAYO TAPER (NEEDLE) ×2
NS IRRIG 1000ML POUR BTL (IV SOLUTION) IMPLANT
PACK ARTHROSCOPY DSU (CUSTOM PROCEDURE TRAY) ×2 IMPLANT
PACK BASIN DAY SURGERY FS (CUSTOM PROCEDURE TRAY) ×2 IMPLANT
PAD CLEANER CAUTERY TIP 5X5 (MISCELLANEOUS)
PENCIL SMOKE EVACUATOR (MISCELLANEOUS) IMPLANT
PORT APPOLLO RF 90DEGREE MULTI (SURGICAL WAND) IMPLANT
RESTRAINT HEAD UNIVERSAL NS (MISCELLANEOUS) ×2 IMPLANT
SLEEVE SCD COMPRESS KNEE MED (MISCELLANEOUS) ×2 IMPLANT
SLING ARM FOAM STRAP LRG (SOFTGOODS) ×1 IMPLANT
SPONGE LAP 4X18 RFD (DISPOSABLE) IMPLANT
SPONGE SURGIFOAM ABS GEL 12-7 (HEMOSTASIS) IMPLANT
STRIP CLOSURE SKIN 1/2X4 (GAUZE/BANDAGES/DRESSINGS) ×2 IMPLANT
SUCTION FRAZIER HANDLE 10FR (MISCELLANEOUS)
SUCTION TUBE FRAZIER 10FR DISP (MISCELLANEOUS) IMPLANT
SUT BONE WAX W31G (SUTURE) IMPLANT
SUT ETHILON 3 0 PS 1 (SUTURE) ×1 IMPLANT
SUT ETHILON 4 0 PS 2 18 (SUTURE) ×2 IMPLANT
SUT FIBERWIRE #2 38 T-5 BLUE (SUTURE) ×2
SUT MNCRL AB 3-0 PS2 18 (SUTURE) IMPLANT
SUT PDS AB 0 CT 36 (SUTURE) IMPLANT
SUT RETRIEVER MED (INSTRUMENTS) IMPLANT
SUT VIC AB 0 CT1 27 (SUTURE)
SUT VIC AB 0 CT1 27XBRD ANBCTR (SUTURE) IMPLANT
SUT VIC AB 2-0 SH 27 (SUTURE) ×2
SUT VIC AB 2-0 SH 27XBRD (SUTURE) ×1 IMPLANT
SUT VIC AB 3-0 FS2 27 (SUTURE) ×2 IMPLANT
SUT VICRYL 4-0 PS2 18IN ABS (SUTURE) IMPLANT
SUTURE FIBERWR #2 38 T-5 BLUE (SUTURE) IMPLANT
SYR BULB EAR ULCER 3OZ GRN STR (SYRINGE) IMPLANT
TOWEL GREEN STERILE FF (TOWEL DISPOSABLE) ×2 IMPLANT
TUBING ARTHROSCOPY IRRIG 16FT (MISCELLANEOUS) ×2 IMPLANT
WATER STERILE IRR 1000ML POUR (IV SOLUTION) ×2 IMPLANT
YANKAUER SUCT BULB TIP NO VENT (SUCTIONS) ×2 IMPLANT

## 2020-07-08 NOTE — Interval H&P Note (Signed)
History and Physical Interval Note:  07/08/2020 7:26 AM  Meghan Murray  has presented today for surgery, with the diagnosis of right shoulder rotator cuff tear.  The various methods of treatment have been discussed with the patient and family. After consideration of risks, benefits and other options for treatment, the patient has consented to  Procedure(s): SHOULDER ARTHROSCOPY WITH OPEN ROTATOR CUFF REPAIR (Right) as a surgical intervention.  The patient's history has been reviewed, patient examined, no change in status, stable for surgery.  I have reviewed the patient's chart and labs.  Questions were answered to the patient's satisfaction.     Marybelle Killings

## 2020-07-08 NOTE — H&P (Signed)
Patient: Meghan Murray                                          Date of Birth: 1971-03-15                                                      MRN: [id number] Visit Date: 04/23/2020                                                                     Requested by: Lavella Lemons, PA [place],  [place] [id number] PCP: Lavella Lemons, [place]   Assessment & Plan: Visit Diagnoses:  1. Complete tear of right rotator cuff, unspecified whether traumatic     Plan: We discussed options for treatment.  She like to proceed with shoulder arthroscopy and rotator cuff repair.  We discussed possible arthroscopic versus open procedure and with the retraction she has as well as shoulder demand she has had on her shoulders with push-ups handstands pull-ups I would recommend a mini open repair to make sure there.  Smoothened additionally reinforced.  Questions were elicited and answered she understands request to proceed.  Follow-Up Instructions: No follow-ups on file.   Orders:  No orders of the defined types were placed in this encounter.  No orders of the defined types were placed in this encounter.     Procedures: No procedures performed   Clinical Data: No additional findings.   Subjective:     Chief Complaint  Patient presents with  . Right Shoulder - Follow-up, Pain    MRI review    HPI 49 year old female returns with ongoing problems with pain in her right shoulder at times she rates it as excruciating.  She has reproduced pain with outstretch reaching overhead activities she has problems washing her hair getting dressed.  Problems reaching behind her as well as across her chest.  Patient has long history of activity with CrossFit including pull-ups push-ups etc.  She has had gradual increased symptoms despite activity modification with workouts.  MRI scan is available for review and shows full-thickness rotator cuff tear of the supraspinatus greater than 3 cm  retracted but no atrophy.  She has been doing activity modification since back in November 2020 without relief.  She has had previous injection anti-inflammatories intermittent ice and rest.  Review of Systems 14 point systems reviewed noncontributory to HPI.   Objective: Vital Signs: BP 116/72   Pulse 67   Ht 5\' 1"  (1.549 m)   Wt 131 lb (59.4 kg)   BMI 24.75 kg/m   Physical Exam Constitutional:      Appearance: She is well-developed.  HENT:     Head: Normocephalic.     Right Ear: External ear normal.     Left Ear: External ear normal.  Eyes:     Pupils: Pupils are equal, round, and reactive to light.  Neck:     Thyroid: No thyromegaly.     Trachea:  No tracheal deviation.  Cardiovascular:     Rate and Rhythm: Normal rate.  Pulmonary:     Effort: Pulmonary effort is normal.  Abdominal:     Palpations: Abdomen is soft.  Skin:    General: Skin is warm and dry.  Neurological:     Mental Status: She is alert and oriented to person, place, and time.  Psychiatric:        Behavior: Behavior normal.     Ortho Exam patient has no periscapular atrophy excellent muscle development upper extremities good shoulders.  Positive drop arm test on the right negative on the left.  Internal and external rotation of the shoulder is strong some pain with crossarm positioning.  Negative liftoff test.  Reflexes are 2+ sensation hand is intact no evidence of peripheral nerve entrapment.  Lower extremities are normal normal heel toe gait excellent lower extremity strength.  Specialty Comments:  No specialty comments available.  Imaging: Rotator cuff tendinopathy with a complete supraspinatus tendon tear  just medial to the tendon insertion. Gap between tendon fragments is  3-3.5 cm. No atrophy.   Mild acromioclavicular osteoarthritis and mild subacromial spurring.   Subacromial/subdeltoid bursitis.     PMFS History:     Patient Active Problem List   Diagnosis Date Noted   . Complete tear of right rotator cuff 04/29/2020  . Impingement syndrome of right shoulder 10/05/2019  . History of endometritis 10/05/2019  . Encounter for insertion of mirena IUD 04/16/2016  . Lymph nodes enlarged 04/16/2016  . Hot flashes 10/30/2015  . Depression (emotion) 10/30/2015  . Depression with anxiety 03/21/2013       Past Medical History:  Diagnosis Date  . Depression   . Depression (emotion) 10/30/2015  . GAD (generalized anxiety disorder)   . Hot flashes 10/30/2015  . IUD (intrauterine device) in place 04/16/2016  . Lymph nodes enlarged 04/16/2016   Right under arm had CT and has seen Dr Rush Farmer   . Moody 10/30/2015  . Swollen lymph nodes    has had CT and saw Dr Rush Farmer         Family History  Problem Relation Age of Onset  . Cancer Mother        breast  . Breast cancer Mother   . Hypertension Father   . Cancer Maternal Grandmother        breast  . Breast cancer Maternal Grandmother   . Heart attack Maternal Grandfather   . Other Paternal Grandfather        died in MVA         Past Surgical History:  Procedure Laterality Date  . ABDOMINOPLASTY    . CESAREAN SECTION     x2   Social History        Occupational History  . Not on file  Tobacco Use  . Smoking status: Never Smoker  . Smokeless tobacco: Never Used  Vaping Use  . Vaping Use: Never used  Substance and Sexual Activity  . Alcohol use: No  . Drug use: No  . Sexual activity: Yes    Birth control/protection: I.U.D.

## 2020-07-08 NOTE — Anesthesia Procedure Notes (Signed)
Procedure Name: LMA Insertion Date/Time: 07/08/2020 7:43 AM Performed by: Myna Bright, CRNA Pre-anesthesia Checklist: Patient identified, Emergency Drugs available, Suction available and Patient being monitored Patient Re-evaluated:Patient Re-evaluated prior to induction Oxygen Delivery Method: Circle system utilized Preoxygenation: Pre-oxygenation with 100% oxygen Induction Type: IV induction Ventilation: Mask ventilation without difficulty LMA: LMA inserted LMA Size: 4.0 Number of attempts: 1 Placement Confirmation: positive ETCO2 and breath sounds checked- equal and bilateral Tube secured with: Tape Dental Injury: Teeth and Oropharynx as per pre-operative assessment

## 2020-07-08 NOTE — Op Note (Signed)
Preop diagnosis: Right shoulder full-thickness supraspinatus tear with retraction, impingement.  Postop diagnosis: Same  Procedure: Right shoulder arthroscopy limited debridement of undersurface rotator cuff.  Mini open subacromial decompression with acromioplasty, repair full-thickness supraspinatus tear.  Surgeon: Rodell Perna, MD  Anesthesia preoperative block plus LMA +10 cc Marcaine local.  Implants Arthrex two-point 6 suture tape anchor.  Brief history 49 year old female works out regularly and was doing Media planner when she started having gradual increased pain in her shoulder difficulty sleeping weakness.  Conservative treatment was started back in November 2020 eventually she had an MRI scan which showed full-thickness supraspinatus tear with retraction back to the glenoid.  Subscapularis and posterior cuff was intact.  She had subacromial injection therapy exercises.  Procedure: After preoperative block induction of general anesthesia LMA tube placement beachchair position careful padding positioning DuraPrep preoperative Ancef prophylaxis timeout procedure the usual arthroscopic shoulder sheets drapes pouch were applied impervious stockinette and Coban.  Scope was introduced from posterior approach anterior portal was made with in and out technique inferior to biceps tendon.  Shoulder was inspected sequentially no Hill-Sachs or Bankart lesion.  Glenohumeral joint was normal.  There was slight fraying and minimal detachment partially of the undersurface of the biceps anchor but no areas of complete tearing and biceps tendon was normal.  There is complete tear of the supraspinatus which was oblique.  Extended all the way to the glenoid undersurface trimmed and debrided.  There is some fraying undersurface of the subscap slightly which was trimmed the biceps exited normally into the bicipital groove.  Posterior labrum was normal.  After partial synovectomy and debridement undersurface rotator cuff  the scope was moved accepting the shoulder dry 3-0 nylon was placed in anteroposterior portal small incision was made lateral aspect of the chromium and splitting the deltoid 1 cm.  Deltoid was peeled off the acromium there is prominent spur this was removed with a three-quarter straight osteotome.  Distal clavicle had slight prominence this was trimmed with a bare rondure.  Subacromial bursectomy was performed.  There was supraspinatus tear that was partially longitudinal to normal with retraction.  The corner was attached suturing it back to the posterior aspect of the cuff which significantly improved position and corrected 50% of the pullback.  Single anchor was placed in the greater tuberosity initially due to hardened bone the self-tapping anchor bent slightly it was straightened and a tiny punch was used just to start the hole and then the anchor was able to go through the bone in appropriate position fit snugly.  Arthrex outflow anchor then had fiber tapes that were used to suture through the rotator cuff pull it forward sutured back down to the freshen up bone surface and then oversewing over the top of the longitudinal split.  Shoulder was rotated there was good repair and additional figure-of-eight sutures were placed again over the top of the longitudinal split which completely covered the humeral head undersurface of the acromion was smooth.  Deltoid was repaired back with fiber tape using free needle through holes in the acromion.  Two 0-0 Vicryl and subtenons tissue 3-0 Vicryl subcuticular closure tincture benzoin Steri-Strips Marcaine infiltration 4 x 4's ABD and sling was applied postoperatively patient tolerated procedure well transfer the care room in stable condition.

## 2020-07-08 NOTE — Discharge Instructions (Signed)
Post Anesthesia Home Care Instructions  Activity: Get plenty of rest for the remainder of the day. A responsible individual must stay with you for 24 hours following the procedure.  For the next 24 hours, DO NOT: -Drive a car -Paediatric nurse -Drink alcoholic beverages -Take any medication unless instructed by your physician -Make any legal decisions or sign important papers.  Meals: Start with liquid foods such as gelatin or soup. Progress to regular foods as tolerated. Avoid greasy, spicy, heavy foods. If nausea and/or vomiting occur, drink only clear liquids until the nausea and/or vomiting subsides. Call your physician if vomiting continues.  Special Instructions/Symptoms: Your throat may feel dry or sore from the anesthesia or the breathing tube placed in your throat during surgery. If this causes discomfort, gargle with warm salt water. The discomfort should disappear within 24 hours.  If you had a scopolamine patch placed behind your ear for the management of post- operative nausea and/or vomiting:  1. The medication in the patch is effective for 72 hours, after which it should be removed.  Wrap patch in a tissue and discard in the trash. Wash hands thoroughly with soap and water. 2. You may remove the patch earlier than 72 hours if you experience unpleasant side effects which may include dry mouth, dizziness or visual disturbances. 3. Avoid touching the patch. Wash your hands with soap and water after contact with the patch.       Call your surgeon if you experience:   1.  Fever over 101.0. 2.  Inability to urinate. 3.  Nausea and/or vomiting. 4.  Extreme swelling or bruising at the surgical site. 5.  Continued bleeding from the incision. 6.  Increased pain, redness or drainage from the incision. 7.  Problems related to your pain medication. 8.  Any problems and/or concerns      Regional Anesthesia Blocks  1. Numbness or the inability to move the "blocked"  extremity may last from 3-48 hours after placement. The length of time depends on the medication injected and your individual response to the medication. If the numbness is not going away after 48 hours, call your surgeon.  2. The extremity that is blocked will need to be protected until the numbness is gone and the  Strength has returned. Because you cannot feel it, you will need to take extra care to avoid injury. Because it may be weak, you may have difficulty moving it or using it. You may not know what position it is in without looking at it while the block is in effect.  3. For blocks in the legs and feet, returning to weight bearing and walking needs to be done carefully. You will need to wait until the numbness is entirely gone and the strength has returned. You should be able to move your leg and foot normally before you try and bear weight or walk. You will need someone to be with you when you first try to ensure you do not fall and possibly risk injury.  4. Bruising and tenderness at the needle site are common side effects and will resolve in a few days.  5. Persistent numbness or new problems with movement should be communicated to the surgeon or the [facility] [phone] [place] [phone]).   You may remove dressing at 48 hrs and take a shower with arm across your chest. Dry off after shower keeping arm across your chest then re apply sling.  You can use a small  dressing over the steri strips or large band aid and small band aid over single sutures from arthroscopy front and back of your shoulder.  See Dr. Lorin Mercy in one week. Use ice on and off for a few days to decrease pain. Your may be more comfortable sleeping in a recliner or beach chair position for several days.    Information for Discharge Teaching: EXPAREL (bupivacaine liposome injectable suspension)   Your surgeon or anesthesiologist gave you EXPAREL(bupivacaine) to help control your pain  after surgery.   EXPAREL is a local anesthetic that provides pain relief by numbing the tissue around the surgical site.  EXPAREL is designed to release pain medication over time and can control pain for up to 72 hours.  Depending on how you respond to EXPAREL, you may require less pain medication during your recovery.  Possible side effects:  Temporary loss of sensation or ability to move in the area where bupivacaine was injected.  Nausea, vomiting, constipation  Rarely, numbness and tingling in your mouth or lips, lightheadedness, or anxiety may occur.  Call your doctor right away if you think you may be experiencing any of these sensations, or if you have other questions regarding possible side effects.  Follow all other discharge instructions given to you by your surgeon or nurse. Eat a healthy diet and drink plenty of water or other fluids.  If you return to the hospital for any reason within 96 hours following the administration of EXPAREL, it is important for health care providers to know that you have received this anesthetic. A teal colored band has been placed on your arm with the date, time and amount of EXPAREL you have received in order to alert and inform your health care providers. Please leave this armband in place for the full 96 hours following administration, and then you may remove the band.

## 2020-07-08 NOTE — Anesthesia Postprocedure Evaluation (Signed)
Anesthesia Post Note  Patient: Meghan Murray  Procedure(s) Performed: SHOULDER ARTHROSCOPY WITH OPEN ROTATOR CUFF REPAIR (Right Shoulder)     Patient location during evaluation: PACU Anesthesia Type: General Level of consciousness: awake and alert Pain management: pain level controlled Vital Signs Assessment: post-procedure vital signs reviewed and stable Respiratory status: spontaneous breathing, nonlabored ventilation and respiratory function stable Cardiovascular status: blood pressure returned to baseline and stable Postop Assessment: no apparent nausea or vomiting Anesthetic complications: no   No complications documented.  Last Vitals:  Vitals:   07/08/20 1015 07/08/20 1028  BP: 100/69 99/67  Pulse: (!) 49 (!) 50  Resp: 14   Temp:    SpO2: 97% 100%    Last Pain:  Vitals:   07/08/20 1015  TempSrc:   PainSc: 0-No pain                 Lynda Rainwater

## 2020-07-08 NOTE — Progress Notes (Signed)
Assisted Dr. Miller with right, ultrasound guided, interscalene  block. Side rails up, monitors on throughout procedure. See vital signs in flow sheet. Tolerated Procedure well. 

## 2020-07-08 NOTE — Anesthesia Preprocedure Evaluation (Signed)
Anesthesia Evaluation  Patient identified by MRN, date of birth, ID band Patient awake    Reviewed: Allergy & Precautions, NPO status , Patient's Chart, lab work & pertinent test results  History of Anesthesia Complications (+) PONV  Airway Mallampati: II  TM Distance: >3 FB Neck ROM: Full    Dental no notable dental hx.    Pulmonary neg pulmonary ROS,    Pulmonary exam normal breath sounds clear to auscultation       Cardiovascular negative cardio ROS Normal cardiovascular exam Rhythm:Regular Rate:Normal     Neuro/Psych Anxiety Depression negative neurological ROS  negative psych ROS   GI/Hepatic negative GI ROS, Neg liver ROS,   Endo/Other  negative endocrine ROS  Renal/GU negative Renal ROS  negative genitourinary   Musculoskeletal negative musculoskeletal ROS (+)   Abdominal   Peds negative pediatric ROS (+)  Hematology negative hematology ROS (+)   Anesthesia Other Findings   Reproductive/Obstetrics negative OB ROS                             Anesthesia Physical Anesthesia Plan  ASA: II  Anesthesia Plan: General   Post-op Pain Management:  Regional for Post-op pain   Induction: Intravenous  PONV Risk Score and Plan: 4 or greater and Ondansetron, Dexamethasone, Midazolam, Droperidol and Treatment may vary due to age or medical condition  Airway Management Planned: LMA  Additional Equipment:   Intra-op Plan:   Post-operative Plan: Extubation in OR  Informed Consent: I have reviewed the patients History and Physical, chart, labs and discussed the procedure including the risks, benefits and alternatives for the proposed anesthesia with the patient or authorized representative who has indicated his/her understanding and acceptance.     Dental advisory given  Plan Discussed with: CRNA  Anesthesia Plan Comments:         Anesthesia Quick Evaluation

## 2020-07-08 NOTE — Anesthesia Procedure Notes (Signed)
Anesthesia Regional Block: Interscalene brachial plexus block   Pre-Anesthetic Checklist: ,, timeout performed, Correct Patient, Correct Site, Correct Laterality, Correct Procedure, Correct Position, site marked, Risks and benefits discussed,  Surgical consent,  Pre-op evaluation,  At surgeon's request and post-op pain management  Laterality: Right  Prep: chloraprep       Needles:  Injection technique: Single-shot  Needle Type: Stimiplex     Needle Length: 9cm  Needle Gauge: 21     Additional Needles:   Procedures:,,,, ultrasound used (permanent image in chart),,,,  Narrative:  Start time: 07/08/2020 7:22 AM End time: 07/08/2020 7:27 AM Injection made incrementally with aspirations every 5 mL.  Performed by: Personally  Anesthesiologist: Lynda Rainwater, MD

## 2020-07-08 NOTE — Transfer of Care (Signed)
Immediate Anesthesia Transfer of Care Note  Patient: Meghan Murray  Procedure(s) Performed: SHOULDER ARTHROSCOPY WITH OPEN ROTATOR CUFF REPAIR (Right Shoulder)  Patient Location: PACU  Anesthesia Type:GA combined with regional for post-op pain  Level of Consciousness: sedated and responds to stimulation  Airway & Oxygen Therapy: Patient Spontanous Breathing and Patient connected to nasal cannula oxygen  Post-op Assessment: Report given to RN and Post -op Vital signs reviewed and stable  Post vital signs: Reviewed and stable  Last Vitals:  Vitals Value Taken Time  BP 96/55 07/08/20 0908  Temp    Pulse 48 07/08/20 0910  Resp 11 07/08/20 0910  SpO2 100 % 07/08/20 0910  Vitals shown include unvalidated device data.  Last Pain:  Vitals:   07/08/20 0703  TempSrc: Oral  PainSc: 5          Complications: No complications documented.

## 2020-07-09 ENCOUNTER — Encounter (HOSPITAL_BASED_OUTPATIENT_CLINIC_OR_DEPARTMENT_OTHER): Payer: Self-pay | Admitting: Orthopaedic Surgery

## 2020-07-16 ENCOUNTER — Inpatient Hospital Stay: Payer: 59 | Admitting: Orthopaedic Surgery

## 2020-07-16 DIAGNOSIS — L738 Other specified follicular disorders: Secondary | ICD-10-CM | POA: Diagnosis not present

## 2020-07-16 DIAGNOSIS — D1801 Hemangioma of skin and subcutaneous tissue: Secondary | ICD-10-CM | POA: Diagnosis not present

## 2020-07-16 DIAGNOSIS — L814 Other melanin hyperpigmentation: Secondary | ICD-10-CM | POA: Diagnosis not present

## 2020-07-16 DIAGNOSIS — L570 Actinic keratosis: Secondary | ICD-10-CM | POA: Diagnosis not present

## 2020-07-17 ENCOUNTER — Other Ambulatory Visit: Payer: Self-pay | Admitting: Physician Assistant

## 2020-07-17 DIAGNOSIS — Z1231 Encounter for screening mammogram for malignant neoplasm of breast: Secondary | ICD-10-CM

## 2020-07-18 ENCOUNTER — Encounter: Payer: Self-pay | Admitting: Orthopaedic Surgery

## 2020-07-18 ENCOUNTER — Ambulatory Visit (INDEPENDENT_AMBULATORY_CARE_PROVIDER_SITE_OTHER): Payer: 59 | Admitting: Orthopaedic Surgery

## 2020-07-18 ENCOUNTER — Other Ambulatory Visit: Payer: Self-pay

## 2020-07-18 VITALS — Ht 61.0 in | Wt 132.0 lb

## 2020-07-18 DIAGNOSIS — M75121 Complete rotator cuff tear or rupture of right shoulder, not specified as traumatic: Secondary | ICD-10-CM

## 2020-07-18 NOTE — Progress Notes (Signed)
   Post-Op Visit Note   Patient: Meghan Murray           Date of Birth: Feb 05, 1971           MRN: [id number] Visit Date: 07/18/2020 PCP: Lavella Lemons, PA   Assessment & Plan: Post rotator cuff repair.  She has an oblique tear and most of the supraspinatus was repaired side to side.  One single anchor was used in the bone.  Sutures are harvested today she is taking half Percocet at night if needed.  She can do some cervical paining elephant swings use a pulley to use her other arm to pull it up and down and then replace her arm in the sling.  Recheck 3 weeks and she will be at 4 weeks postop at that time.  Chief Complaint:  Chief Complaint  Patient presents with  . Right Shoulder - Routine Post Op    07/08/2020 right shoulder arthroscopy, RCR   Visit Diagnoses:  1. Complete tear of right rotator cuff, unspecified whether traumatic     Plan: Return 3 weeks.  Follow-Up Instructions: Return in about 3 weeks (around 08/08/2020).   Orders:  No orders of the defined types were placed in this encounter.  No orders of the defined types were placed in this encounter.   Imaging: No results found.  PMFS History: Patient Active Problem List   Diagnosis Date Noted  . Complete tear of right rotator cuff 04/29/2020  . Impingement syndrome of right shoulder 10/05/2019  . History of endometritis 10/05/2019  . Encounter for insertion of mirena IUD 04/16/2016  . Lymph nodes enlarged 04/16/2016  . Hot flashes 10/30/2015  . Depression (emotion) 10/30/2015  . Depression with anxiety 03/21/2013   Past Medical History:  Diagnosis Date  . Depression   . Depression (emotion) 10/30/2015  . GAD (generalized anxiety disorder)   . Hot flashes 10/30/2015  . IUD (intrauterine device) in place 04/16/2016  . Lymph nodes enlarged 04/16/2016   Right under arm had CT and has seen Dr Rush Farmer   . Moody 10/30/2015  . PONV (postoperative nausea and vomiting)   . Swollen lymph nodes    has had CT and  saw Dr Rush Farmer    Family History  Problem Relation Age of Onset  . Cancer Mother        breast  . Breast cancer Mother   . Depression Mother   . Hypertension Father   . Cancer Maternal Grandmother        breast  . Breast cancer Maternal Grandmother   . Heart attack Maternal Grandfather   . Other Paternal Grandfather        died in MVA    Past Surgical History:  Procedure Laterality Date  . ABDOMINOPLASTY    . CESAREAN SECTION     x2  . SHOULDER ARTHROSCOPY WITH OPEN ROTATOR CUFF REPAIR Right 07/08/2020   Procedure: SHOULDER ARTHROSCOPY WITH OPEN ROTATOR CUFF REPAIR;  Surgeon: Marybelle Killings, MD;  Location: [place];  Service: Orthopedics;  Laterality: Right;   Social History   Occupational History  . Not on file  Tobacco Use  . Smoking status: Never Smoker  . Smokeless tobacco: Never Used  Vaping Use  . Vaping Use: Never used  Substance and Sexual Activity  . Alcohol use: No  . Drug use: No  . Sexual activity: Yes    Birth control/protection: I.U.D.

## 2020-07-23 ENCOUNTER — Other Ambulatory Visit: Payer: Self-pay

## 2020-07-23 ENCOUNTER — Ambulatory Visit (INDEPENDENT_AMBULATORY_CARE_PROVIDER_SITE_OTHER): Payer: 59 | Admitting: Psychiatry

## 2020-07-23 DIAGNOSIS — F331 Major depressive disorder, recurrent, moderate: Secondary | ICD-10-CM | POA: Diagnosis not present

## 2020-07-23 NOTE — Progress Notes (Signed)
°      [facility] Counselor/Therapist Progress Note  Patient ID: Meghan Murray, MRN: [id number],    Date: 07/24/2020  Time Spent: 51 minutes start time 8:05 AM end time 8:56 AM  Treatment Type: Individual Therapy  Reported Symptoms: anxiety, sadness, crying spells, sleep issues  Mental Status Exam:  Appearance:   Well Groomed     Behavior:  Appropriate  Motor:  Normal  Speech/Language:   Normal Rate  Affect:  Appropriate  Mood:  sad  Thought process:  normal  Thought content:    WNL  Sensory/Perceptual disturbances:    WNL  Orientation:  oriented to person, place, time/date and situation  Attention:  Good  Concentration:  Good  Memory:  WNL  Fund of knowledge:   Good  Insight:    Good  Judgment:   Good  Impulse Control:  Good   Risk Assessment: Danger to Self:  No Self-injurious Behavior: No Danger to Others: No Duty to Warn:no Physical Aggression / Violence:No  Access to Firearms a concern: No  Gang Involvement:No   Subjective: Patient was present for session.  She shared that she was recovering from surgery.  She explained that she has had an increase in her depression since the surgery and that is primary issue she wants to address in treatment.  Treatment plan and goals were developed with patient could not sign due to COVID.  Patient went on to share she is still having lots of struggles figuring out what it if she wants to do for her future.  She explained she feels very stuck in her situation.  Patient went on to share she is not sure how to move forward because her husband is not in a place where he wants to and neither of her children are either.  Patient was allowed time just to share what has occurred within her marriage over the past 30 years.  Patient was able to recognize that she would get overwhelmed by how much her husband talked and wanted to share things and just shut down.  Discussed some ways that she can start changing the dynamics in the  relationship whether or not they stay together at least she can learn how to have positive interactions.  Patient agreed to work on those things.  Talked patient grounded and 5 exercise to use when she starts getting very emotional.  Will start doing processing work at next session.  Interventions: Solution-Oriented/Positive Psychology  Diagnosis:   ICD-10-CM   1. Major depressive disorder, recurrent episode, moderate ([place])  F33.1     Plan: Patient is to use coping skills discussed in session to decrease depression symptoms.  Patient is to continue thinking through what she wants within her marriage and starting to talk to her husband about some of the different issues.  Patient is to get back to exercising as soon as she is released from her shoulder surgery. Long-term goal: Reduce irritability and increase normal social interaction with friends and family. Short-term goal: Begin to experience sadness in session while discussing the disappointment related to loss and pain from the past  Lina Sayre, [facility]

## 2020-08-05 ENCOUNTER — Ambulatory Visit (INDEPENDENT_AMBULATORY_CARE_PROVIDER_SITE_OTHER): Payer: 59 | Admitting: Psychiatry

## 2020-08-05 ENCOUNTER — Other Ambulatory Visit: Payer: Self-pay

## 2020-08-05 DIAGNOSIS — F330 Major depressive disorder, recurrent, mild: Secondary | ICD-10-CM | POA: Diagnosis not present

## 2020-08-05 NOTE — Progress Notes (Signed)
°      [facility] Counselor/Therapist Progress Note  Patient ID: Meghan Murray, MRN: [id number],    Date: 08/05/2020  Time Spent: 52 minutes start time 8:07 AM end time 8:59 AM  Treatment Type: Individual Therapy  Reported Symptoms: sadness, anger, anxiety, guilt, triggered responses  Mental Status Exam:  Appearance:   Well Groomed     Behavior:  Appropriate  Motor:  Normal  Speech/Language:   Normal Rate  Affect:  Appropriate  Mood:  sad  Thought process:  normal  Thought content:    WNL  Sensory/Perceptual disturbances:    WNL  Orientation:  oriented to person, place, time/date and situation  Attention:  Good  Concentration:  Good  Memory:  WNL  Fund of knowledge:   Good  Insight:    Good  Judgment:   Good  Impulse Control:  Good   Risk Assessment: Danger to Self:  No Self-injurious Behavior: No Danger to Others: No Duty to Warn:no Physical Aggression / Violence:No  Access to Firearms a concern: No  Gang Involvement:No   Subjective: Patient was present for session.  She shared that she is still struggling with her situation with her husband and she is not sure if things will ever change. She explained she still feels very captive and knows that it is not healthy for any of them.  Did EMDR set on a situation with her husband when he asked her which way to go, suds level 8, negative cognition "I am mean" felt guilt in her chest.  Patient was able to reduce suds level to 4.  Through the processing it came out that there have been years of patient holding all the hurt and frustration inside.  Discussed the fact that it is going to come out in unhealthy ways if she does not start practicing confronting different things with her husband appropriately.  Patient agreed to try and practice confronting things as they happen when she is at a good place rather than letting things build up and exploding.  Patient was also encouraged to start journaling situations that are very  triggering for her.     Interventions: Solution-Oriented/Positive Psychology and Eye Movement Desensitization and Reprocessing (EMDR)  Diagnosis:   ICD-10-CM   1. MDD (major depressive disorder), recurrent episode, mild ([place])  F33.0     Plan: Patient is to use CBT and coping skills to decrease depression symptoms.  Patient is to work on journaling regularly to release emotions appropriately.  Patient is to practice confronting her husband as negative situations occur rather than holding everything inside. Long-term goal: Reduce irritability and increase normal social interaction with family and friends Short-term goal: Begin to experience sadness in session while discussing the disappointment related to the loss and pain from the past  Lina Sayre, [facility]

## 2020-08-06 ENCOUNTER — Encounter: Payer: Self-pay | Admitting: Orthopaedic Surgery

## 2020-08-06 ENCOUNTER — Ambulatory Visit (INDEPENDENT_AMBULATORY_CARE_PROVIDER_SITE_OTHER): Payer: 59 | Admitting: Orthopaedic Surgery

## 2020-08-06 ENCOUNTER — Telehealth: Payer: Self-pay | Admitting: Orthopaedic Surgery

## 2020-08-06 VITALS — BP 104/65 | HR 60 | Ht 61.0 in | Wt 132.0 lb

## 2020-08-06 DIAGNOSIS — Z4889 Encounter for other specified surgical aftercare: Secondary | ICD-10-CM | POA: Diagnosis not present

## 2020-08-06 DIAGNOSIS — M25519 Pain in unspecified shoulder: Secondary | ICD-10-CM | POA: Diagnosis not present

## 2020-08-06 DIAGNOSIS — M75121 Complete rotator cuff tear or rupture of right shoulder, not specified as traumatic: Secondary | ICD-10-CM

## 2020-08-06 NOTE — Progress Notes (Signed)
   Post-Op Visit Note   Patient: Meghan Murray           Date of Birth: 07/01/1971           MRN: [id number] Visit Date: 08/06/2020 PCP: Lavella Lemons, PA   Assessment & Plan: Post rotator cuff repair.  She wants to do her therapy and eating.  Work slip given no work x3 weeks recheck 3 weeks.  She wants to get back to work as soon as she is able.  Chief Complaint:  Chief Complaint  Patient presents with  . Right Shoulder - Follow-up    07/08/2020 right shoulder arthroscopy, RCR   Visit Diagnoses: No diagnosis found.  Plan: Patient works as a Marine scientist at [employer].  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  Recheck 3 weeks.  Follow-Up Instructions: No follow-ups on file.   Orders:  No orders of the defined types were placed in this encounter.  No orders of the defined types were placed in this encounter.   Imaging: No results found.  PMFS History: Patient Active Problem List   Diagnosis Date Noted  . Complete tear of right rotator cuff 04/29/2020  . Impingement syndrome of right shoulder 10/05/2019  . History of endometritis 10/05/2019  . Encounter for insertion of mirena IUD 04/16/2016  . Lymph nodes enlarged 04/16/2016  . Hot flashes 10/30/2015  . Depression (emotion) 10/30/2015  . Depression with anxiety 03/21/2013   Past Medical History:  Diagnosis Date  . Depression   . Depression (emotion) 10/30/2015  . GAD (generalized anxiety disorder)   . Hot flashes 10/30/2015  . IUD (intrauterine device) in place 04/16/2016  . Lymph nodes enlarged 04/16/2016   Right under arm had CT and has seen Dr Rush Farmer   . Moody 10/30/2015  . PONV (postoperative nausea and vomiting)   . Swollen lymph nodes    has had CT and saw Dr Rush Farmer    Family History  Problem Relation Age of Onset  . Cancer Mother        breast  . Breast cancer Mother   . Depression Mother   . Hypertension Father   . Cancer Maternal Grandmother        breast  . Breast  cancer Maternal Grandmother   . Heart attack Maternal Grandfather   . Other Paternal Grandfather        died in MVA    Past Surgical History:  Procedure Laterality Date  . ABDOMINOPLASTY    . CESAREAN SECTION     x2  . SHOULDER ARTHROSCOPY WITH OPEN ROTATOR CUFF REPAIR Right 07/08/2020   Procedure: SHOULDER ARTHROSCOPY WITH OPEN ROTATOR CUFF REPAIR;  Surgeon: Marybelle Killings, MD;  Location: [place];  Service: Orthopedics;  Laterality: Right;   Social History   Occupational History  . Not on file  Tobacco Use  . Smoking status: Never Smoker  . Smokeless tobacco: Never Used  Vaping Use  . Vaping Use: Never used  Substance and Sexual Activity  . Alcohol use: No  . Drug use: No  . Sexual activity: Yes    Birth control/protection: I.U.D.

## 2020-08-06 NOTE — Telephone Encounter (Signed)
Debbie with PT called wanting to know if Dr.Yates had a protocol to follow in regards to orders for the pt?  Debbie CB# [phone]

## 2020-08-07 NOTE — Telephone Encounter (Signed)
I called and advised of his instructions for PT.  She will keep at gentle AAROM and PROM until follow up 08/27/2020 in office.

## 2020-08-09 DIAGNOSIS — Z4889 Encounter for other specified surgical aftercare: Secondary | ICD-10-CM | POA: Diagnosis not present

## 2020-08-09 DIAGNOSIS — M25519 Pain in unspecified shoulder: Secondary | ICD-10-CM | POA: Diagnosis not present

## 2020-08-13 DIAGNOSIS — Z20828 Contact with and (suspected) exposure to other viral communicable diseases: Secondary | ICD-10-CM | POA: Diagnosis not present

## 2020-08-13 DIAGNOSIS — M25519 Pain in unspecified shoulder: Secondary | ICD-10-CM | POA: Diagnosis not present

## 2020-08-13 DIAGNOSIS — Z4889 Encounter for other specified surgical aftercare: Secondary | ICD-10-CM | POA: Diagnosis not present

## 2020-08-15 DIAGNOSIS — Z4889 Encounter for other specified surgical aftercare: Secondary | ICD-10-CM | POA: Diagnosis not present

## 2020-08-15 DIAGNOSIS — M25519 Pain in unspecified shoulder: Secondary | ICD-10-CM | POA: Diagnosis not present

## 2020-08-19 ENCOUNTER — Ambulatory Visit (INDEPENDENT_AMBULATORY_CARE_PROVIDER_SITE_OTHER): Payer: 59 | Admitting: Psychiatry

## 2020-08-19 ENCOUNTER — Other Ambulatory Visit: Payer: Self-pay

## 2020-08-19 DIAGNOSIS — E039 Hypothyroidism, unspecified: Secondary | ICD-10-CM | POA: Diagnosis not present

## 2020-08-19 DIAGNOSIS — F331 Major depressive disorder, recurrent, moderate: Secondary | ICD-10-CM

## 2020-08-19 DIAGNOSIS — N951 Menopausal and female climacteric states: Secondary | ICD-10-CM | POA: Diagnosis not present

## 2020-08-19 NOTE — Progress Notes (Signed)
      [facility] Counselor/Therapist Progress Note  Patient ID: Meghan Murray, MRN: [id number],    Date: 08/19/2020  Time Spent: 52 minutes start time 1:05 PM and time 1:57 PM  Treatment Type: Individual Therapy  Reported Symptoms: anxiety, sadness, anger, hurt, isolation  Mental Status Exam:  Appearance:   Casual and Neat     Behavior:  Appropriate  Motor:  Normal  Speech/Language:   Normal Rate  Affect:  Appropriate tearful  Mood:  sad  Thought process:  normal  Thought content:    WNL  Sensory/Perceptual disturbances:    WNL  Orientation:  oriented to person, place, time/date and situation  Attention:  Good  Concentration:  Good  Memory:  WNL  Fund of knowledge:   Good  Insight:    Good  Judgment:   Good  Impulse Control:  Good   Risk Assessment: Danger to Self:  No Self-injurious Behavior: No Danger to Others: No Duty to Warn:no Physical Aggression / Violence:No  Access to Firearms a concern: No  Gang Involvement:No   Subjective: Patient was present for session.  She shared that her anniversary was as bad as she thought it might.  She shared that she is still feeling very split with wether or not she can stay with her husband.  Patient was encouraged to think through when she started feeling so disconnected from her husband.  Patient stated that it is been going on for a long time but what has been most disturbing for her and makes her feel that things will never change was when he made her share things with her son concerning their relationship.  Did EMDR set on that issue, suds level 10, negative cognition "I ruined our family" felt pain and anger in her throat.  Patient was able to reduce suds level to 7.  She was able to realize that neither she or her husband truly know each other the way they should after 54 years of marriage.  Discussed ways that she can communicate concerns with her husband and try and work on developing more of a connection in the  relationship to help her decide what she needs to do for her future.  Patient was also encouraged to confront her husband on the issue as well.  Interventions: Solution-Oriented/Positive Psychology and Eye Movement Desensitization and Reprocessing (EMDR)  Diagnosis:   ICD-10-CM   1. Major depressive disorder, recurrent episode, moderate ([place])  F33.1     Plan: Patient is to use CBT and coping skills to decrease depression symptoms.  Patient is to try and figure out what she knows her husband finds as important and to ask him to do the same thing.  Patient is to continue getting her shoulder healthy so she can return to work.  Patient will exercise as she can to release negative emotions appropriately. Long-term goal: Reduce irritability and increase normal social interaction with family and friends Short-term goal: Begin to experience sadness in session while discussing the disappointment related to the loss or pain from the past   Lina Sayre, [facility]

## 2020-08-20 DIAGNOSIS — M25519 Pain in unspecified shoulder: Secondary | ICD-10-CM | POA: Diagnosis not present

## 2020-08-20 DIAGNOSIS — Z4889 Encounter for other specified surgical aftercare: Secondary | ICD-10-CM | POA: Diagnosis not present

## 2020-08-21 ENCOUNTER — Telehealth: Payer: Self-pay | Admitting: Orthopaedic Surgery

## 2020-08-21 NOTE — Telephone Encounter (Signed)
Please advise 

## 2020-08-21 NOTE — Telephone Encounter (Signed)
Yes OK. thanks

## 2020-08-21 NOTE — Telephone Encounter (Signed)
Received call from Verl Dicker with [facility] asking if patient can start active range of motion? Jackelyn Poling said patient is doing well. The number to contact Jackelyn Poling is [phone]

## 2020-08-22 DIAGNOSIS — Z4889 Encounter for other specified surgical aftercare: Secondary | ICD-10-CM | POA: Diagnosis not present

## 2020-08-22 DIAGNOSIS — M25519 Pain in unspecified shoulder: Secondary | ICD-10-CM | POA: Diagnosis not present

## 2020-08-22 NOTE — Telephone Encounter (Signed)
I called Debbie and advised. 

## 2020-08-27 ENCOUNTER — Ambulatory Visit (INDEPENDENT_AMBULATORY_CARE_PROVIDER_SITE_OTHER): Payer: 59 | Admitting: Orthopaedic Surgery

## 2020-08-27 ENCOUNTER — Encounter: Payer: Self-pay | Admitting: Orthopaedic Surgery

## 2020-08-27 DIAGNOSIS — Z9889 Other specified postprocedural states: Secondary | ICD-10-CM

## 2020-08-27 NOTE — Progress Notes (Signed)
   Post-Op Visit Note   Patient: Meghan Murray           Date of Birth: 1970-09-21           MRN: [id number] Visit Date: 08/27/2020 PCP: Lavella Lemons, PA   Assessment & Plan:  Chief Complaint:  Chief Complaint  Patient presents with  . Right Shoulder - Routine Post Op, Follow-up   Visit Diagnoses: Post right rotator cuff repair.  She can resume work on 09/03/2020.  Prescription written for therapy rotator cuff strengthening.  I can follow her up and check her in 2 months if she still having problems.  Plan: continue PT with strengthing. RC.  ROV 2 months  Follow-Up Instructions: No follow-ups on file.   Orders:  No orders of the defined types were placed in this encounter.  No orders of the defined types were placed in this encounter.   Imaging: No results found.  PMFS History: Patient Active Problem List   Diagnosis Date Noted  . S/P right rotator cuff repair 08/27/2020  . Complete tear of right rotator cuff 04/29/2020  . Impingement syndrome of right shoulder 10/05/2019  . History of endometritis 10/05/2019  . Encounter for insertion of mirena IUD 04/16/2016  . Lymph nodes enlarged 04/16/2016  . Hot flashes 10/30/2015  . Depression (emotion) 10/30/2015  . Depression with anxiety 03/21/2013   Past Medical History:  Diagnosis Date  . Depression   . Depression (emotion) 10/30/2015  . GAD (generalized anxiety disorder)   . Hot flashes 10/30/2015  . IUD (intrauterine device) in place 04/16/2016  . Lymph nodes enlarged 04/16/2016   Right under arm had CT and has seen Dr Rush Farmer   . Moody 10/30/2015  . PONV (postoperative nausea and vomiting)   . Swollen lymph nodes    has had CT and saw Dr Rush Farmer    Family History  Problem Relation Age of Onset  . Cancer Mother        breast  . Breast cancer Mother   . Depression Mother   . Hypertension Father   . Cancer Maternal Grandmother        breast  . Breast cancer Maternal Grandmother   . Heart attack Maternal  Grandfather   . Other Paternal Grandfather        died in MVA    Past Surgical History:  Procedure Laterality Date  . ABDOMINOPLASTY    . CESAREAN SECTION     x2  . SHOULDER ARTHROSCOPY WITH OPEN ROTATOR CUFF REPAIR Right 07/08/2020   Procedure: SHOULDER ARTHROSCOPY WITH OPEN ROTATOR CUFF REPAIR;  Surgeon: Marybelle Killings, MD;  Location: [place];  Service: Orthopedics;  Laterality: Right;   Social History   Occupational History  . Not on file  Tobacco Use  . Smoking status: Never Smoker  . Smokeless tobacco: Never Used  Vaping Use  . Vaping Use: Never used  Substance and Sexual Activity  . Alcohol use: No  . Drug use: No  . Sexual activity: Yes    Birth control/protection: I.U.D.

## 2020-08-28 ENCOUNTER — Ambulatory Visit: Payer: 59

## 2020-08-29 DIAGNOSIS — Z3202 Encounter for pregnancy test, result negative: Secondary | ICD-10-CM | POA: Diagnosis not present

## 2020-08-29 DIAGNOSIS — Z4889 Encounter for other specified surgical aftercare: Secondary | ICD-10-CM | POA: Diagnosis not present

## 2020-08-29 DIAGNOSIS — N951 Menopausal and female climacteric states: Secondary | ICD-10-CM | POA: Diagnosis not present

## 2020-08-29 DIAGNOSIS — R5383 Other fatigue: Secondary | ICD-10-CM | POA: Diagnosis not present

## 2020-08-29 DIAGNOSIS — Z6824 Body mass index (BMI) 24.0-24.9, adult: Secondary | ICD-10-CM | POA: Diagnosis not present

## 2020-08-29 DIAGNOSIS — R232 Flushing: Secondary | ICD-10-CM | POA: Diagnosis not present

## 2020-08-29 DIAGNOSIS — E039 Hypothyroidism, unspecified: Secondary | ICD-10-CM | POA: Diagnosis not present

## 2020-08-29 DIAGNOSIS — N719 Inflammatory disease of uterus, unspecified: Secondary | ICD-10-CM | POA: Diagnosis not present

## 2020-08-29 DIAGNOSIS — M25519 Pain in unspecified shoulder: Secondary | ICD-10-CM | POA: Diagnosis not present

## 2020-08-29 DIAGNOSIS — Z975 Presence of (intrauterine) contraceptive device: Secondary | ICD-10-CM | POA: Diagnosis not present

## 2020-08-30 DIAGNOSIS — Z1389 Encounter for screening for other disorder: Secondary | ICD-10-CM | POA: Diagnosis not present

## 2020-08-30 DIAGNOSIS — Z1331 Encounter for screening for depression: Secondary | ICD-10-CM | POA: Diagnosis not present

## 2020-08-30 DIAGNOSIS — F329 Major depressive disorder, single episode, unspecified: Secondary | ICD-10-CM | POA: Diagnosis not present

## 2020-09-02 ENCOUNTER — Other Ambulatory Visit: Payer: Self-pay

## 2020-09-02 ENCOUNTER — Ambulatory Visit (INDEPENDENT_AMBULATORY_CARE_PROVIDER_SITE_OTHER): Payer: 59 | Admitting: Psychiatry

## 2020-09-02 DIAGNOSIS — M25519 Pain in unspecified shoulder: Secondary | ICD-10-CM | POA: Diagnosis not present

## 2020-09-02 DIAGNOSIS — Z4889 Encounter for other specified surgical aftercare: Secondary | ICD-10-CM | POA: Diagnosis not present

## 2020-09-02 DIAGNOSIS — F330 Major depressive disorder, recurrent, mild: Secondary | ICD-10-CM

## 2020-09-02 DIAGNOSIS — F331 Major depressive disorder, recurrent, moderate: Secondary | ICD-10-CM | POA: Insufficient documentation
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
## 2020-09-04 DIAGNOSIS — M25519 Pain in unspecified shoulder: Secondary | ICD-10-CM | POA: Diagnosis not present

## 2020-09-04 DIAGNOSIS — Z4889 Encounter for other specified surgical aftercare: Secondary | ICD-10-CM | POA: Diagnosis not present

## 2020-09-09 DIAGNOSIS — Z4889 Encounter for other specified surgical aftercare: Secondary | ICD-10-CM | POA: Diagnosis not present

## 2020-09-09 DIAGNOSIS — M25519 Pain in unspecified shoulder: Secondary | ICD-10-CM | POA: Diagnosis not present

## 2020-09-11 DIAGNOSIS — Z4889 Encounter for other specified surgical aftercare: Secondary | ICD-10-CM | POA: Diagnosis not present

## 2020-09-11 DIAGNOSIS — M25519 Pain in unspecified shoulder: Secondary | ICD-10-CM | POA: Diagnosis not present

## 2020-09-16 ENCOUNTER — Ambulatory Visit (INDEPENDENT_AMBULATORY_CARE_PROVIDER_SITE_OTHER): Payer: 59 | Admitting: Psychiatry

## 2020-09-16 ENCOUNTER — Other Ambulatory Visit: Payer: Self-pay

## 2020-09-16 DIAGNOSIS — F330 Major depressive disorder, recurrent, mild: Secondary | ICD-10-CM

## 2020-09-16 DIAGNOSIS — Z4889 Encounter for other specified surgical aftercare: Secondary | ICD-10-CM | POA: Diagnosis not present

## 2020-09-16 DIAGNOSIS — M25519 Pain in unspecified shoulder: Secondary | ICD-10-CM | POA: Diagnosis not present
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
## 2020-09-18 DIAGNOSIS — M25519 Pain in unspecified shoulder: Secondary | ICD-10-CM | POA: Diagnosis not present

## 2020-09-18 DIAGNOSIS — Z4889 Encounter for other specified surgical aftercare: Secondary | ICD-10-CM | POA: Diagnosis not present

## 2020-09-23 DIAGNOSIS — M25519 Pain in unspecified shoulder: Secondary | ICD-10-CM | POA: Diagnosis not present

## 2020-09-23 DIAGNOSIS — Z4889 Encounter for other specified surgical aftercare: Secondary | ICD-10-CM | POA: Diagnosis not present

## 2020-09-25 DIAGNOSIS — Z4889 Encounter for other specified surgical aftercare: Secondary | ICD-10-CM | POA: Diagnosis not present

## 2020-09-25 DIAGNOSIS — M25519 Pain in unspecified shoulder: Secondary | ICD-10-CM | POA: Diagnosis not present

## 2020-09-30 ENCOUNTER — Ambulatory Visit: Payer: 59 | Admitting: Psychiatry

## 2020-09-30 DIAGNOSIS — Z4889 Encounter for other specified surgical aftercare: Secondary | ICD-10-CM | POA: Diagnosis not present

## 2020-09-30 DIAGNOSIS — M25519 Pain in unspecified shoulder: Secondary | ICD-10-CM | POA: Diagnosis not present

## 2020-10-02 DIAGNOSIS — Z4889 Encounter for other specified surgical aftercare: Secondary | ICD-10-CM | POA: Diagnosis not present

## 2020-10-02 DIAGNOSIS — M25519 Pain in unspecified shoulder: Secondary | ICD-10-CM | POA: Diagnosis not present

## 2020-10-07 ENCOUNTER — Ambulatory Visit: Payer: 59 | Admitting: Psychiatry

## 2020-10-07 DIAGNOSIS — F329 Major depressive disorder, single episode, unspecified: Secondary | ICD-10-CM | POA: Diagnosis not present

## 2020-10-09 DIAGNOSIS — M25519 Pain in unspecified shoulder: Secondary | ICD-10-CM | POA: Diagnosis not present

## 2020-10-09 DIAGNOSIS — Z4889 Encounter for other specified surgical aftercare: Secondary | ICD-10-CM | POA: Diagnosis not present

## 2020-10-14 ENCOUNTER — Other Ambulatory Visit: Payer: Self-pay

## 2020-10-14 ENCOUNTER — Ambulatory Visit (INDEPENDENT_AMBULATORY_CARE_PROVIDER_SITE_OTHER): Payer: 59 | Admitting: Psychiatry

## 2020-10-14 DIAGNOSIS — F330 Major depressive disorder, recurrent, mild: Secondary | ICD-10-CM | POA: Diagnosis not present
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
## 2020-10-16 DIAGNOSIS — Z4889 Encounter for other specified surgical aftercare: Secondary | ICD-10-CM | POA: Diagnosis not present

## 2020-10-16 DIAGNOSIS — M25519 Pain in unspecified shoulder: Secondary | ICD-10-CM | POA: Diagnosis not present

## 2020-10-21 ENCOUNTER — Ambulatory Visit: Payer: 59 | Admitting: Psychiatry

## 2020-10-28 ENCOUNTER — Ambulatory Visit: Payer: 59 | Admitting: Psychiatry

## 2020-10-30 ENCOUNTER — Ambulatory Visit: Payer: 59 | Admitting: Orthopaedic Surgery

## 2020-11-04 ENCOUNTER — Ambulatory Visit: Payer: 59 | Admitting: Psychiatry

## 2020-11-18 ENCOUNTER — Ambulatory Visit: Payer: 59 | Admitting: Psychiatry

## 2020-11-20 ENCOUNTER — Ambulatory Visit
Admission: RE | Admit: 2020-11-20 | Discharge: 2020-11-20 | Disposition: A | Discharge status: Home / Self Care | Payer: 59 | Source: Ambulatory Visit | Attending: Physician Assistant | Admitting: Physician Assistant

## 2020-11-20 ENCOUNTER — Other Ambulatory Visit: Payer: Self-pay

## 2020-11-20 DIAGNOSIS — Z1231 Encounter for screening mammogram for malignant neoplasm of breast: Secondary | ICD-10-CM

## 2020-12-02 ENCOUNTER — Ambulatory Visit: Payer: 59 | Admitting: Psychiatry

## 2020-12-16 DIAGNOSIS — E039 Hypothyroidism, unspecified: Secondary | ICD-10-CM | POA: Diagnosis not present

## 2020-12-16 DIAGNOSIS — N951 Menopausal and female climacteric states: Secondary | ICD-10-CM | POA: Diagnosis not present

## 2020-12-25 DIAGNOSIS — R5383 Other fatigue: Secondary | ICD-10-CM | POA: Diagnosis not present

## 2020-12-25 DIAGNOSIS — R232 Flushing: Secondary | ICD-10-CM | POA: Diagnosis not present

## 2020-12-25 DIAGNOSIS — N951 Menopausal and female climacteric states: Secondary | ICD-10-CM | POA: Diagnosis not present

## 2021-01-20 ENCOUNTER — Encounter: Payer: Self-pay | Admitting: Adult Health

## 2021-01-20 ENCOUNTER — Ambulatory Visit (INDEPENDENT_AMBULATORY_CARE_PROVIDER_SITE_OTHER): Payer: 59 | Admitting: Adult Health

## 2021-01-20 ENCOUNTER — Other Ambulatory Visit: Payer: Self-pay

## 2021-01-20 VITALS — BP 102/63 | HR 51 | Ht 61.0 in | Wt 142.8 lb

## 2021-01-20 DIAGNOSIS — Z975 Presence of (intrauterine) contraceptive device: Secondary | ICD-10-CM

## 2021-01-20 DIAGNOSIS — Z01419 Encounter for gynecological examination (general) (routine) without abnormal findings: Secondary | ICD-10-CM

## 2021-01-20 DIAGNOSIS — Z1211 Encounter for screening for malignant neoplasm of colon: Secondary | ICD-10-CM | POA: Diagnosis not present

## 2021-01-20 LAB — HEMOCCULT GUIAC POC 1CARD (OFFICE): Fecal Occult Blood, POC: NEGATIVE

## 2021-01-20 NOTE — Progress Notes (Signed)
Patient ID: NAYLAH CORK, female   DOB: 10-10-1970, 50 y.o.   MRN: [id number] History of Present Illness:  Lajoy is a 50 year old white female, married, G2P2 in for a well woman gyn exam, she had a normal pap with negative HPV 11/27/19.  She says she was treated for endometritis with doxycycline in January or February at PCP.  PCP is [facility].  Current Medications, Allergies, Past Medical History, Past Surgical History, Family History and Social History were reviewed in [employer] record.     Review of Systems: Patient denies any headaches, hearing loss, fatigue, blurred vision, shortness of breath, chest pain, abdominal pain, problems with bowel movements, urination, or intercourse. No joint pain or mood swings. Does not have a period with IUD    Physical Exam:BP 102/63 (BP Location: Right Arm, Patient Position: Sitting, Cuff Size: Normal)   Pulse (!) 51   Ht 5\' 1"  (1.549 m)   Wt 142 lb 12.8 oz (64.8 kg)   BMI 26.98 kg/m  General:  Well developed, well nourished, no acute distress Skin:  Warm and dry Neck:  Midline trachea, normal thyroid, good ROM, no lymphadenopathy Lungs; Clear to auscultation bilaterally Breast:  No dominant palpable mass, retraction, or nipple discharge Cardiovascular: Regular rate and rhythm Abdomen:  Soft, non tender, no hepatosplenomegaly Pelvic:  External genitalia is normal in appearance, no lesions.  The vagina is normal in appearance. Urethra has no lesions or masses. The cervix is smooth, no IUD strings seen, POC US showed IUD in place.  Uterus is felt to be normal size, shape, and contour.  No adnexal masses or tenderness noted.Bladder is non tender, no masses felt. Rectal: Good sphincter tone, no polyps, or hemorrhoids felt.  Hemoccult negative. Extremities/musculoskeletal:  No swelling or varicosities noted, no clubbing or cyanosis Psych:  No mood changes, alert and cooperative,seems happy AA is 0 Fall risk is low PHQ 9  score is 0 GAD 7 score is 2  [facility] - 01/20/21 1346      Pregnancy Intention Screening   Does the patient want to become pregnant in the next year? No    Does the patient's partner want to become pregnant in the next year? No    Would the patient like to discuss contraceptive options today? No      Contraception Wrap Up   Current Method IUD or IUS    End Method IUD or IUS    Contraception Counseling Provided No         Examination chaperoned by [employer].  Impression and Plan: 1. Encounter for well woman exam with routine gynecological exam Physical in 1 year Pap in 2024 Mammogram yearly Colonoscopy advised Labs done at [facility]  2. Encounter for screening fecal occult blood testing   3. IUD (intrauterine device) in place

## 2021-02-07 DIAGNOSIS — M722 Plantar fascial fibromatosis: Secondary | ICD-10-CM | POA: Diagnosis not present

## 2021-02-07 DIAGNOSIS — Z6825 Body mass index (BMI) 25.0-25.9, adult: Secondary | ICD-10-CM | POA: Diagnosis not present

## 2021-02-26 DIAGNOSIS — H5213 Myopia, bilateral: Secondary | ICD-10-CM | POA: Diagnosis not present

## 2021-02-26 DIAGNOSIS — H524 Presbyopia: Secondary | ICD-10-CM | POA: Diagnosis not present

## 2021-02-28 ENCOUNTER — Other Ambulatory Visit: Payer: Self-pay | Admitting: Family Medicine

## 2021-02-28 ENCOUNTER — Other Ambulatory Visit (HOSPITAL_COMMUNITY): Payer: Self-pay | Admitting: Family Medicine

## 2021-02-28 DIAGNOSIS — M722 Plantar fascial fibromatosis: Secondary | ICD-10-CM | POA: Diagnosis not present

## 2021-02-28 DIAGNOSIS — M79672 Pain in left foot: Secondary | ICD-10-CM | POA: Diagnosis not present

## 2021-02-28 DIAGNOSIS — G8929 Other chronic pain: Secondary | ICD-10-CM | POA: Diagnosis not present

## 2021-02-28 DIAGNOSIS — Z6825 Body mass index (BMI) 25.0-25.9, adult: Secondary | ICD-10-CM | POA: Diagnosis not present

## 2021-03-13 IMAGING — MG MM DIGITAL SCREENING BILAT W/ TOMO AND CAD
8 series · 9 of 24 positions shown · non-contrast
Comparison: Previous exam(s).

CLINICAL DATA: Screening.

EXAM:
DIGITAL SCREENING BILATERAL MAMMOGRAM WITH TOMOSYNTHESIS AND CAD
TECHNIQUE: Bilateral screening digital craniocaudal and mediolateral oblique
mammograms were obtained. Bilateral screening digital breast
tomosynthesis was performed. The images were evaluated with
computer-aided detection.

[L MLO synth-2D]
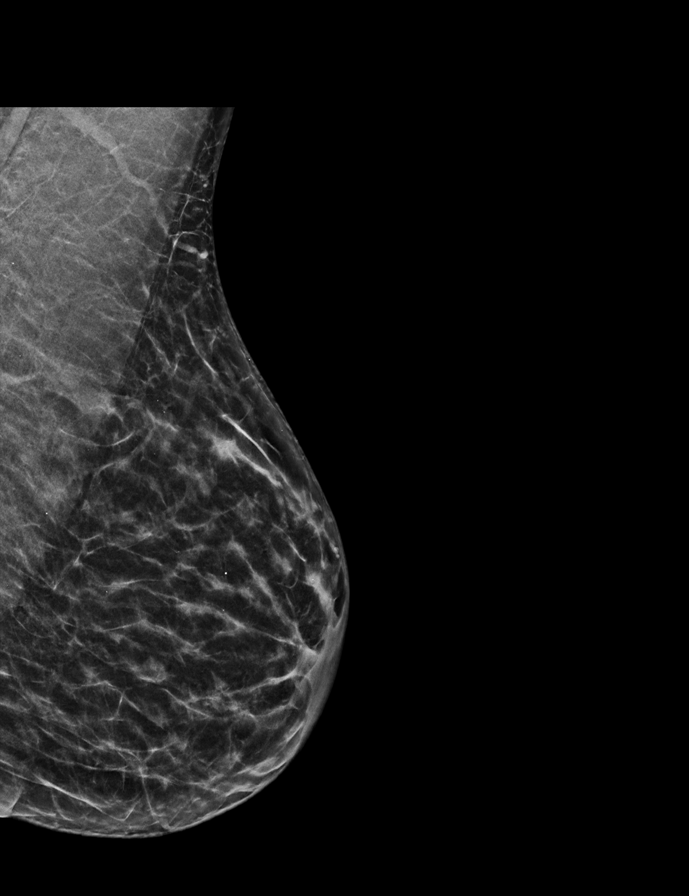

[R CC synth-2D]
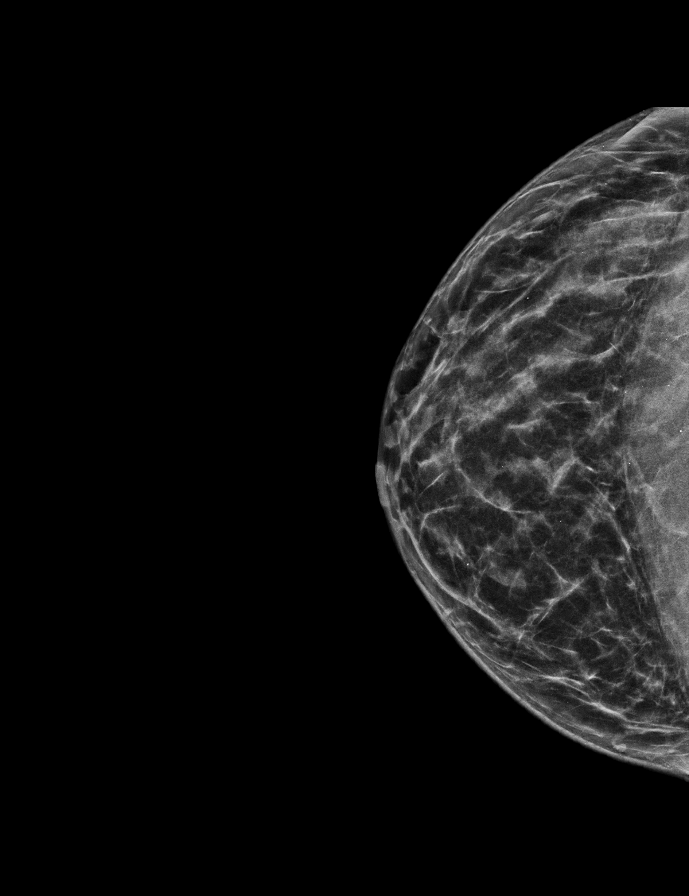

[R MLO synth-2D]
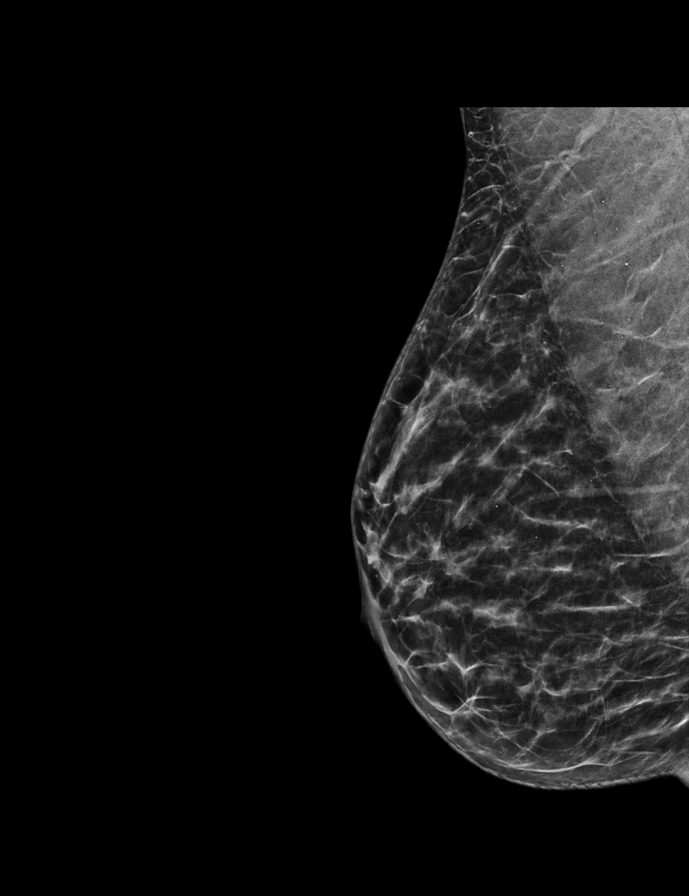

[L CC synth-2D]
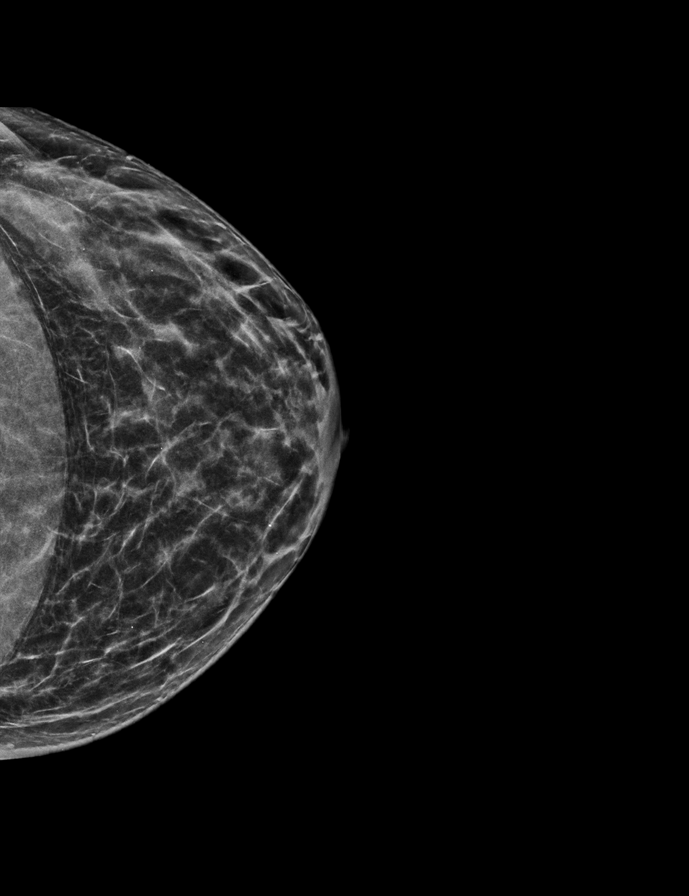

[L CC tomo · 2 of 49 frames shown]
[frame 16/49]
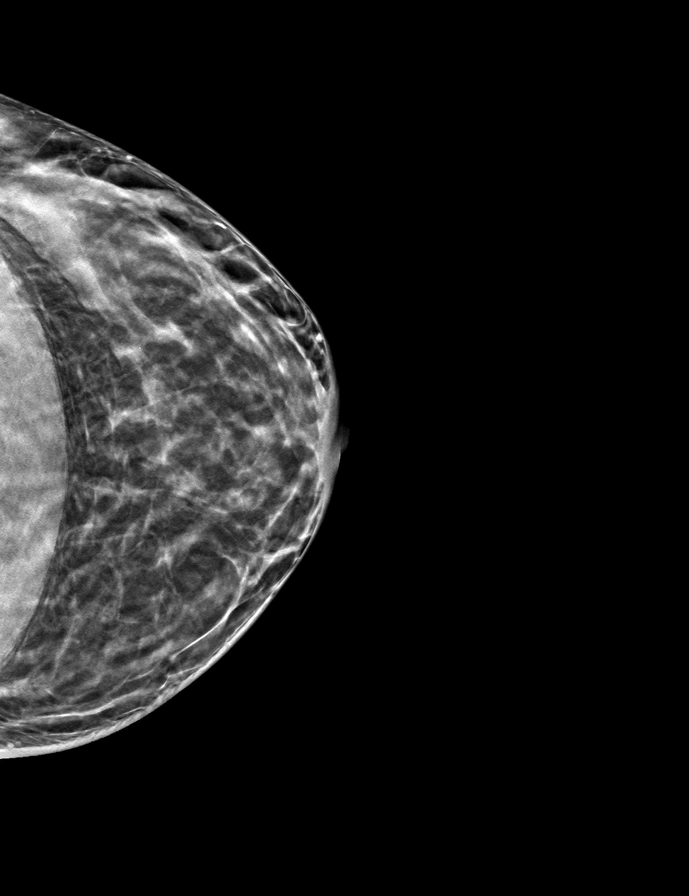
[frame 25/49]
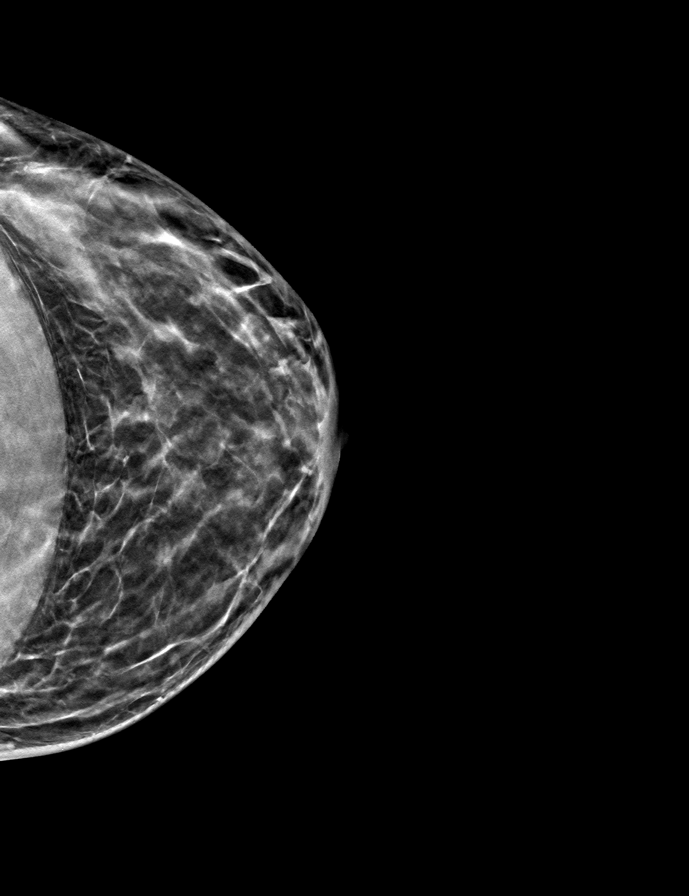

[L MLO tomo · tomo slice 26/51.0]
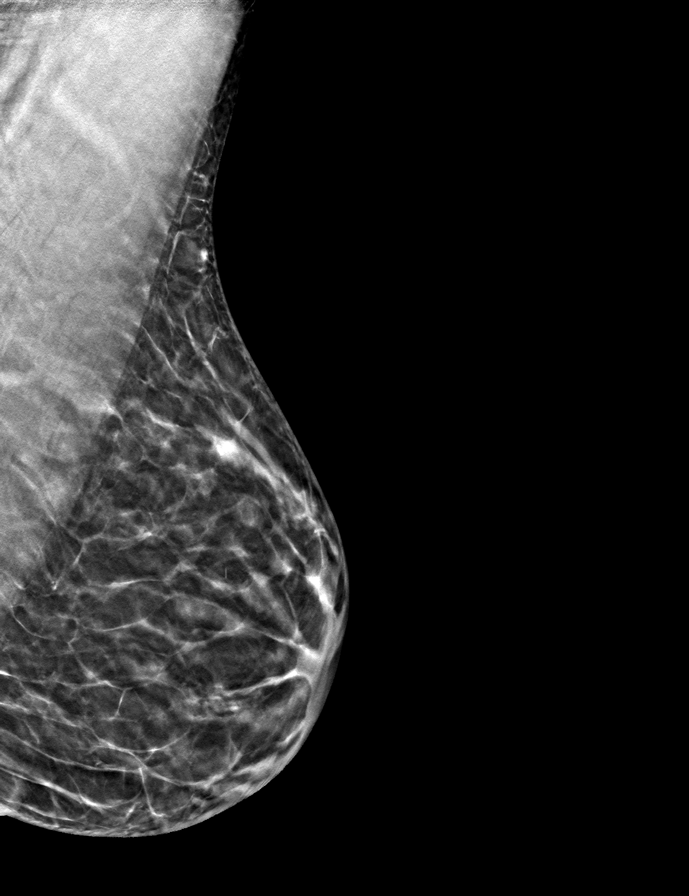

[R CC tomo · tomo slice 26/51.0]
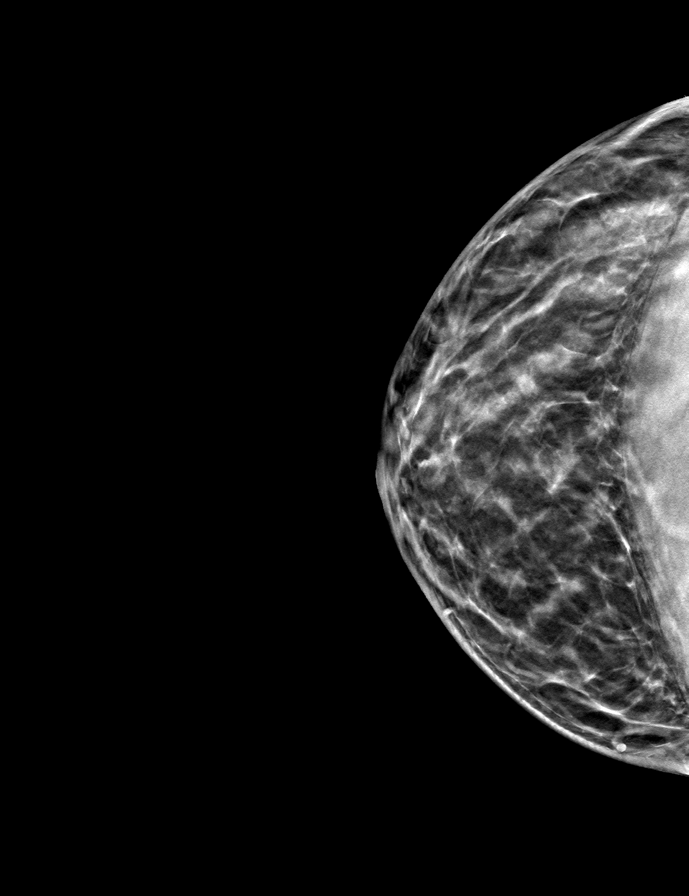

[R MLO tomo · tomo slice 25/50.0]
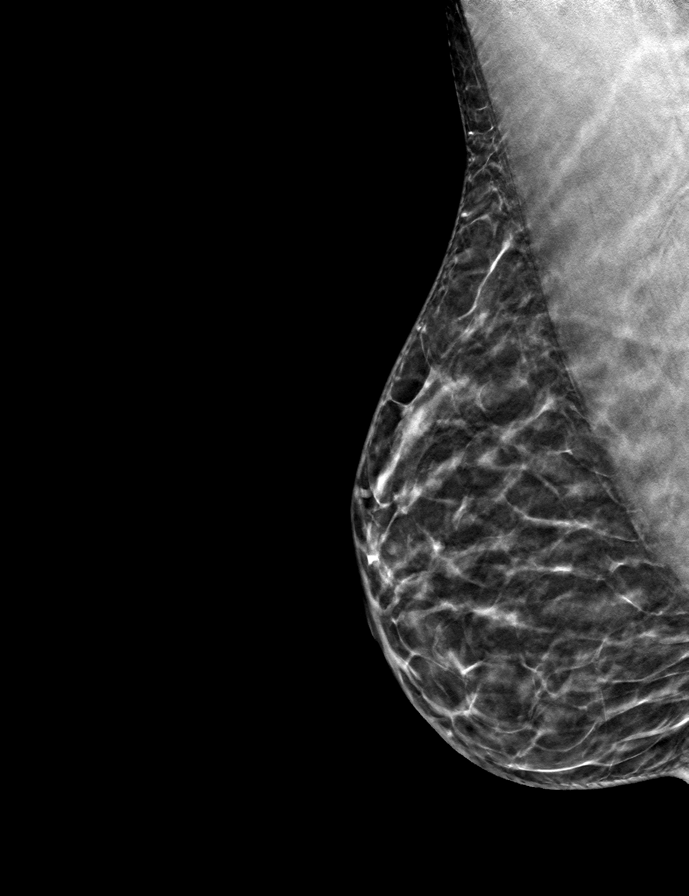

[9 of 24 positions shown; findings below may reference images not displayed]

ACR Breast Density Category c: The breast tissue is heterogeneously
dense, which may obscure small masses.
FINDINGS: There are no findings suspicious for malignancy. The images were
evaluated with computer-aided detection.
IMPRESSION: No mammographic evidence of malignancy. A result letter of this
screening mammogram will be mailed directly to the patient.

RECOMMENDATION:
Screening mammogram in one year. (Code:T4-5-GWO)

BI-RADS CATEGORY  1: Negative.

## 2021-03-14 ENCOUNTER — Ambulatory Visit (HOSPITAL_COMMUNITY)
Admission: RE | Admit: 2021-03-14 | Discharge: 2021-03-14 | Disposition: A | Discharge status: Home / Self Care | Payer: 59 | Source: Ambulatory Visit | Attending: Family Medicine | Admitting: Family Medicine

## 2021-03-14 ENCOUNTER — Other Ambulatory Visit: Payer: Self-pay

## 2021-03-14 DIAGNOSIS — M722 Plantar fascial fibromatosis: Secondary | ICD-10-CM | POA: Diagnosis not present

## 2021-03-14 DIAGNOSIS — M79672 Pain in left foot: Secondary | ICD-10-CM | POA: Insufficient documentation

## 2021-03-14 DIAGNOSIS — R600 Localized edema: Secondary | ICD-10-CM | POA: Diagnosis not present

## 2021-03-19 ENCOUNTER — Ambulatory Visit: Payer: 59 | Admitting: Orthopaedic Surgery

## 2021-03-19 ENCOUNTER — Encounter: Payer: Self-pay | Admitting: Orthopaedic Surgery

## 2021-03-19 DIAGNOSIS — M722 Plantar fascial fibromatosis: Secondary | ICD-10-CM | POA: Diagnosis not present

## 2021-03-19 MED ORDER — NITROGLYCERIN 0.2 MG/HR TD PT24: MEDICATED_PATCH | TRANSDERMAL | 12 refills | Status: DC

## 2021-03-19 MED ORDER — PREDNISONE 10 MG (21) PO TBPK: ORAL_TABLET | ORAL | 0 refills | Status: DC

## 2021-03-19 NOTE — Progress Notes (Signed)
Office Visit Note   Patient: Meghan Murray           Date of Birth: Aug 25, 1971           MRN: [id number] Visit Date: 03/19/2021              Requested by: Lavella Lemons, PA [place],  [place] [address] PCP: Lavella Lemons, [place]   Assessment & Plan: Visit Diagnoses:  1. Plantar fasciitis of right foot     Plan: Impression is chronic left plantar heel pain. Based on her history and exam this seems more consistent with plantar fasciitis versus less likely Baxter's nerve entrapment.  She is not symptomatic to the peroneal tendons therefore reassurance was given that MRI findings are not clinically significant.  She has done appropriate conservative treatment so far. We discussed further options including immobilization in a walking boot, physical therapy exercises, topical analgesics like Voltaren and Pennsaid, a course of steroidal anti-inflammatories, and nitroglycerin patches. We discussed that cortisone injections are also an option but should be used with caution for severe pain with daily activities or pain that is limiting ability to do physical therapy and are not to be used to return to high impact activity such as running and Crossfit due to the risk of rupture. She demonstrated a good understanding. She would like to try OTC Voltaren gel, and we wrote for a prednisone dose pack and nitroglycerin patches. Finally we discussed that this is an overuse injury that will likely benefit from rest, particularly from impact activities. We recommended modifying her activities in such a way to avoid high impact and instead do low impact activities like swimming, biking and rowing until her heel pain improves. We will see her back as needed.   Follow-Up Instructions: No follow-ups on file.   Orders:  No orders of the defined types were placed in this encounter.  Meds ordered this encounter  Medications   predniSONE (STERAPRED UNI-PAK 21 TAB) 10 MG (21) TBPK tablet    Sig: Take  as directed    Dispense:  21 tablet    Refill:  0   nitroGLYCERIN (NITRODUR - DOSED IN MG/24 HR) 0.2 mg/hr patch    Sig: Apply 1/4 patch to area daily prn    Dispense:  3 patch    Refill:  12      Procedures: No procedures performed   Clinical Data: No additional findings.   Subjective: Chief Complaint  Patient presents with   Left Foot - Pain    HEEL PAIN     HPI Meghan Murray is a 50 y.o. female who presents for evaluation of left heel pain for 3 months without a known injury. She does Crossfit and her heel pain seemed to flare up when she was working on double jumps. Initially she had pain to the entire heel but this has gotten better and now she describes burning pain localized to the lateral plantar heel. Her pain is worse with running and jumping rope, and improves with rest. She denies swelling, mechanical symptoms, instability, numbness or tingling. Previous treatment includes ice, stretching, NSAIDs, orthotics, night splint, ultrasound therapy all of which provided some relief. She is currently taking Tylenol. She has not tried steroidal antiinflammatories, boot immobilization or physical therapy. 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 She does have heel pain at work. She has continued to do Crossfit but has cut out running. She had  x-rays of the foot ordered by her PCP which were negative, and an MRI a few days ago.   Review of Systems Review of Systems was reviewed and negative unless as stated in HPI.   Objective: Vital Signs: There were no vitals taken for this visit.  Physical Exam Vitals and nursing note reviewed.  Constitutional:      Appearance: She is well-developed.  Pulmonary:     Effort: Pulmonary effort is normal.  Skin:    General: Skin is warm.     Capillary Refill: Capillary refill takes less than 2 seconds.  Neurological:     Mental Status: She is alert and oriented to person, place, and time.  Psychiatric:         Behavior: Behavior normal.        Thought Content: Thought content normal.        Judgment: Judgment normal.    Ortho Exam Left heel exam demonstrates no swelling or deformity. She is tender over the lateral plantar fascia origin. No Achilles tenderness or medial plantar fascia tenderness. She does not have any tenderness or instability of the peroneal tendons. Strength and ROM are within normal limits with dorsiflexion, plantar flexion, inversion and eversion. Distal neurovascular exam intact.   Specialty Comments:  No specialty comments available.  Imaging: MRI of the left foot dated 03/14/21 was reviewed today and demonstrates bone marrow edema of the plantar aspect of the calcaneus consistent with plantar fasciitis.   PMFS History: Patient Active Problem List   Diagnosis Date Noted   Plantar fasciitis of right foot 03/19/2021   Encounter for screening fecal occult blood testing 01/20/2021   Encounter for well woman exam with routine gynecological exam 01/20/2021   Major depressive disorder, recurrent episode, moderate ([place]) 09/02/2020   S/P right rotator cuff repair 08/27/2020   Complete tear of right rotator cuff 04/29/2020   Impingement syndrome of right shoulder 10/05/2019   IUD (intrauterine device) in place 10/05/2019   History of endometritis 10/05/2019   Encounter for insertion of mirena IUD 04/16/2016   Lymph nodes enlarged 04/16/2016   Hot flashes 10/30/2015   Depression (emotion) 10/30/2015   Depression with anxiety 03/21/2013   Past Medical History:  Diagnosis Date   Depression    Depression (emotion) 10/30/2015   GAD (generalized anxiety disorder)    Hot flashes 10/30/2015   IUD (intrauterine device) in place 04/16/2016   Lymph nodes enlarged 04/16/2016   Right under arm had CT and has seen Dr Jeanella Cara 10/30/2015   PONV (postoperative nausea and vomiting)    Swollen lymph nodes    has had CT and saw Dr Rush Farmer    Family History  Problem Relation Age of  Onset   Cancer Mother        breast   Breast cancer Mother    Depression Mother    Hypertension Father    Cancer Maternal Grandmother        breast   Breast cancer Maternal Grandmother    Heart attack Maternal Grandfather    Other Paternal Grandfather        died in MVA    Past Surgical History:  Procedure Laterality Date   ABDOMINOPLASTY     CESAREAN SECTION     x2   SHOULDER ARTHROSCOPY WITH OPEN ROTATOR CUFF REPAIR Right 07/08/2020   Procedure: SHOULDER ARTHROSCOPY WITH OPEN ROTATOR CUFF REPAIR;  Surgeon: Marybelle Killings, MD;  Location: [facility];  Service: Orthopedics;  Laterality:  Right;   Social History   Occupational History   Not on file  Tobacco Use   Smoking status: Never   Smokeless tobacco: Never  Vaping Use   Vaping Use: Never used  Substance and Sexual Activity   Alcohol use: No   Drug use: No   Sexual activity: Yes    Birth control/protection: I.U.D.

## 2021-04-21 DIAGNOSIS — N951 Menopausal and female climacteric states: Secondary | ICD-10-CM | POA: Diagnosis not present

## 2021-04-21 DIAGNOSIS — E039 Hypothyroidism, unspecified: Secondary | ICD-10-CM | POA: Diagnosis not present

## 2021-04-23 DIAGNOSIS — N951 Menopausal and female climacteric states: Secondary | ICD-10-CM | POA: Diagnosis not present

## 2021-04-23 DIAGNOSIS — Z6827 Body mass index (BMI) 27.0-27.9, adult: Secondary | ICD-10-CM | POA: Diagnosis not present

## 2021-04-23 DIAGNOSIS — E039 Hypothyroidism, unspecified: Secondary | ICD-10-CM | POA: Diagnosis not present

## 2021-04-23 DIAGNOSIS — R232 Flushing: Secondary | ICD-10-CM | POA: Diagnosis not present

## 2021-04-23 DIAGNOSIS — R5383 Other fatigue: Secondary | ICD-10-CM | POA: Diagnosis not present

## 2021-08-11 DIAGNOSIS — E039 Hypothyroidism, unspecified: Secondary | ICD-10-CM | POA: Diagnosis not present

## 2021-08-11 DIAGNOSIS — N951 Menopausal and female climacteric states: Secondary | ICD-10-CM | POA: Diagnosis not present

## 2021-08-13 DIAGNOSIS — Z6825 Body mass index (BMI) 25.0-25.9, adult: Secondary | ICD-10-CM | POA: Diagnosis not present

## 2021-08-13 DIAGNOSIS — R232 Flushing: Secondary | ICD-10-CM | POA: Diagnosis not present

## 2021-08-13 DIAGNOSIS — N951 Menopausal and female climacteric states: Secondary | ICD-10-CM | POA: Diagnosis not present

## 2021-08-13 DIAGNOSIS — G479 Sleep disorder, unspecified: Secondary | ICD-10-CM | POA: Diagnosis not present

## 2021-08-13 DIAGNOSIS — E039 Hypothyroidism, unspecified: Secondary | ICD-10-CM | POA: Diagnosis not present

## 2021-10-20 ENCOUNTER — Other Ambulatory Visit: Payer: Self-pay | Admitting: Physician Assistant

## 2021-10-20 DIAGNOSIS — N719 Inflammatory disease of uterus, unspecified: Secondary | ICD-10-CM | POA: Diagnosis not present

## 2021-10-20 DIAGNOSIS — Z1231 Encounter for screening mammogram for malignant neoplasm of breast: Secondary | ICD-10-CM

## 2021-10-20 DIAGNOSIS — Z6824 Body mass index (BMI) 24.0-24.9, adult: Secondary | ICD-10-CM | POA: Diagnosis not present

## 2021-10-27 DIAGNOSIS — Z411 Encounter for cosmetic surgery: Secondary | ICD-10-CM | POA: Diagnosis not present

## 2021-10-27 DIAGNOSIS — D1801 Hemangioma of skin and subcutaneous tissue: Secondary | ICD-10-CM | POA: Diagnosis not present

## 2021-10-27 DIAGNOSIS — D229 Melanocytic nevi, unspecified: Secondary | ICD-10-CM | POA: Diagnosis not present

## 2021-10-27 DIAGNOSIS — L821 Other seborrheic keratosis: Secondary | ICD-10-CM | POA: Diagnosis not present

## 2021-10-27 DIAGNOSIS — Z23 Encounter for immunization: Secondary | ICD-10-CM | POA: Diagnosis not present

## 2021-10-27 DIAGNOSIS — L578 Other skin changes due to chronic exposure to nonionizing radiation: Secondary | ICD-10-CM | POA: Diagnosis not present

## 2021-10-27 DIAGNOSIS — L814 Other melanin hyperpigmentation: Secondary | ICD-10-CM | POA: Diagnosis not present

## 2021-10-29 ENCOUNTER — Other Ambulatory Visit (HOSPITAL_COMMUNITY)
Admission: RE | Admit: 2021-10-29 | Discharge: 2021-10-29 | Disposition: A | Discharge status: Home / Self Care | Payer: 59 | Source: Ambulatory Visit | Attending: Advanced Practice Midwife | Admitting: Advanced Practice Midwife

## 2021-10-29 ENCOUNTER — Encounter: Payer: Self-pay | Admitting: Advanced Practice Midwife

## 2021-10-29 ENCOUNTER — Other Ambulatory Visit: Payer: Self-pay

## 2021-10-29 ENCOUNTER — Ambulatory Visit: Payer: 59 | Admitting: Advanced Practice Midwife

## 2021-10-29 VITALS — BP 105/54 | HR 57 | Ht 61.0 in | Wt 145.0 lb

## 2021-10-29 DIAGNOSIS — N898 Other specified noninflammatory disorders of vagina: Secondary | ICD-10-CM

## 2021-10-29 DIAGNOSIS — R102 Pelvic and perineal pain: Secondary | ICD-10-CM | POA: Diagnosis not present

## 2021-10-29 NOTE — Progress Notes (Signed)
° °  GYN VISIT Patient name: Meghan Murray MRN [id number]  Date of birth: 27-Jul-1971 Chief Complaint:   Follow-up (IUD Check)  History of Present Illness:   Meghan Murray is a 51 y.o. G27P2002 Caucasian female being seen today for low abd pain/discomfort and nausea x 3 weeks; ny diarrhea, fever, or dysuria. Has completed a course of doxy with some relief of the abd pain; had neg UA at recent PCP visit.   No LMP recorded. (Menstrual status: IUD). The current method of family planning is IUD.  Last pap March 2021. Results were: NILM w/ HRHPV negative  Depression screen [facility] 2/9 01/20/2021 11/27/2019 11/08/2014  Decreased Interest 0 0 0  Down, Depressed, Hopeless 0 0 0  PHQ - 2 Score 0 0 0  Altered sleeping 0 - -  Tired, decreased energy 0 - -  Change in appetite 0 - -  Feeling bad or failure about yourself  0 - -  Trouble concentrating 0 - -  Moving slowly or fidgety/restless 0 - -  Suicidal thoughts 0 - -  PHQ-9 Score 0 - -     GAD 7 : Generalized Anxiety Score 01/20/2021  Nervous, Anxious, on Edge 1  Control/stop worrying 0  Worry too much - different things 0  Trouble relaxing 0  Restless 0  Easily annoyed or irritable 1  Afraid - awful might happen 0  Total GAD 7 Score 2     Review of Systems:   Pertinent items are noted in HPI Denies fever/chills, dizziness, headaches, visual disturbances, fatigue, shortness of breath, chest pain, abdominal pain, vomiting, abnormal vaginal discharge/itching/odor/irritation, problems with periods, bowel movements, urination, or intercourse unless otherwise stated above.  Pertinent History Reviewed:  Reviewed past medical,surgical, social, obstetrical and family history.  Reviewed problem list, medications and allergies. Physical Assessment:   Vitals:   10/29/21 1113  BP: (!) 105/54  Pulse: (!) 57  Weight: 145 lb (65.8 kg)  Height: 5\' 1"  (1.549 m)  Body mass index is 27.4 kg/m.       Physical Examination:   General appearance:  alert, well appearing, and in no distress  Mental status: alert, oriented to person, place, and time  Skin: warm & dry   Cardiovascular: normal heart rate noted  Respiratory: normal respiratory effort, no distress  Abdomen: soft, tender @ mid abd on bimanual  Pelvic: normal external genitalia, vulva, vagina, cervix, uterus and adnexa, CERVIX: normal appearing cervix without discharge or lesions, cervical motion tenderness present (slight); creamy white vag d/c; IUD strings not visualized; fundal IUD placement verified by Chrissie Noa at bedside and three 2-2.5cm ovarian cysts/follicles seen  Extremities: no edema   Chaperone: Celene Squibb     No results found for this or any previous visit (from the past 24 hour(s)).  Assessment & Plan:  1) Pelvic pain> IUD placement verified; CV swab sent to r/o infectious cause  2) Nausea> possibly GI virus  Meds: No orders of the defined types were placed in this encounter.   No orders of the defined types were placed in this encounter.   Return for Pap & Physical March 2024.  Myrtis Ser CNM 10/29/2021 12:51 PM

## 2021-10-30 DIAGNOSIS — I872 Venous insufficiency (chronic) (peripheral): Secondary | ICD-10-CM | POA: Diagnosis not present

## 2021-10-30 DIAGNOSIS — I87393 Chronic venous hypertension (idiopathic) with other complications of bilateral lower extremity: Secondary | ICD-10-CM | POA: Diagnosis not present

## 2021-10-30 LAB — CERVICOVAGINAL ANCILLARY ONLY
Bacterial Vaginitis (gardnerella): NEGATIVE
Candida Glabrata: NEGATIVE
Candida Vaginitis: NEGATIVE
Chlamydia: NEGATIVE
Comment: NEGATIVE
Comment: NEGATIVE
Comment: NEGATIVE
Comment: NEGATIVE
Comment: NEGATIVE
Comment: NORMAL
Neisseria Gonorrhea: NEGATIVE
Trichomonas: NEGATIVE

## 2021-10-31 DIAGNOSIS — I872 Venous insufficiency (chronic) (peripheral): Secondary | ICD-10-CM | POA: Diagnosis not present

## 2021-11-06 DIAGNOSIS — N951 Menopausal and female climacteric states: Secondary | ICD-10-CM | POA: Diagnosis not present

## 2021-11-06 DIAGNOSIS — E039 Hypothyroidism, unspecified: Secondary | ICD-10-CM | POA: Diagnosis not present

## 2021-11-10 DIAGNOSIS — F329 Major depressive disorder, single episode, unspecified: Secondary | ICD-10-CM | POA: Diagnosis not present

## 2021-11-10 DIAGNOSIS — Z6826 Body mass index (BMI) 26.0-26.9, adult: Secondary | ICD-10-CM | POA: Diagnosis not present

## 2021-11-12 DIAGNOSIS — N951 Menopausal and female climacteric states: Secondary | ICD-10-CM | POA: Diagnosis not present

## 2021-11-12 DIAGNOSIS — Z6826 Body mass index (BMI) 26.0-26.9, adult: Secondary | ICD-10-CM | POA: Diagnosis not present

## 2021-11-12 DIAGNOSIS — R232 Flushing: Secondary | ICD-10-CM | POA: Diagnosis not present

## 2021-11-12 DIAGNOSIS — F32A Depression, unspecified: Secondary | ICD-10-CM | POA: Diagnosis not present

## 2021-11-12 DIAGNOSIS — E039 Hypothyroidism, unspecified: Secondary | ICD-10-CM | POA: Diagnosis not present

## 2021-11-18 DIAGNOSIS — I83893 Varicose veins of bilateral lower extremities with other complications: Secondary | ICD-10-CM | POA: Diagnosis not present

## 2021-11-21 ENCOUNTER — Ambulatory Visit
Admission: RE | Admit: 2021-11-21 | Discharge: 2021-11-21 | Disposition: A | Discharge status: Home / Self Care | Payer: 59 | Source: Ambulatory Visit | Attending: Physician Assistant | Admitting: Physician Assistant

## 2021-11-21 DIAGNOSIS — Z1231 Encounter for screening mammogram for malignant neoplasm of breast: Secondary | ICD-10-CM

## 2021-11-24 DIAGNOSIS — I83893 Varicose veins of bilateral lower extremities with other complications: Secondary | ICD-10-CM | POA: Diagnosis not present

## 2021-11-24 DIAGNOSIS — G2581 Restless legs syndrome: Secondary | ICD-10-CM | POA: Diagnosis not present

## 2022-02-13 DIAGNOSIS — N719 Inflammatory disease of uterus, unspecified: Secondary | ICD-10-CM | POA: Diagnosis not present

## 2022-02-13 DIAGNOSIS — I10 Essential (primary) hypertension: Secondary | ICD-10-CM | POA: Diagnosis not present

## 2022-02-13 DIAGNOSIS — R350 Frequency of micturition: Secondary | ICD-10-CM | POA: Diagnosis not present

## 2022-02-13 DIAGNOSIS — Z6826 Body mass index (BMI) 26.0-26.9, adult: Secondary | ICD-10-CM | POA: Diagnosis not present

## 2022-02-16 DIAGNOSIS — E039 Hypothyroidism, unspecified: Secondary | ICD-10-CM | POA: Diagnosis not present

## 2022-02-16 DIAGNOSIS — N951 Menopausal and female climacteric states: Secondary | ICD-10-CM | POA: Diagnosis not present

## 2022-02-23 DIAGNOSIS — G479 Sleep disorder, unspecified: Secondary | ICD-10-CM | POA: Diagnosis not present

## 2022-02-23 DIAGNOSIS — R6882 Decreased libido: Secondary | ICD-10-CM | POA: Diagnosis not present

## 2022-02-23 DIAGNOSIS — N951 Menopausal and female climacteric states: Secondary | ICD-10-CM | POA: Diagnosis not present

## 2022-02-23 DIAGNOSIS — E039 Hypothyroidism, unspecified: Secondary | ICD-10-CM | POA: Diagnosis not present

## 2022-02-23 DIAGNOSIS — Z6827 Body mass index (BMI) 27.0-27.9, adult: Secondary | ICD-10-CM | POA: Diagnosis not present

## 2022-03-04 DIAGNOSIS — H5213 Myopia, bilateral: Secondary | ICD-10-CM | POA: Diagnosis not present

## 2022-03-04 DIAGNOSIS — H524 Presbyopia: Secondary | ICD-10-CM | POA: Diagnosis not present

## 2022-05-14 DIAGNOSIS — N951 Menopausal and female climacteric states: Secondary | ICD-10-CM | POA: Diagnosis not present

## 2022-05-14 DIAGNOSIS — E039 Hypothyroidism, unspecified: Secondary | ICD-10-CM | POA: Diagnosis not present

## 2022-05-16 ENCOUNTER — Other Ambulatory Visit (HOSPITAL_COMMUNITY): Payer: Self-pay

## 2022-05-18 ENCOUNTER — Other Ambulatory Visit (HOSPITAL_COMMUNITY): Payer: Self-pay

## 2022-05-18 MED ORDER — ESTRADIOL 0.05 MG/24HR TD PTTW
MEDICATED_PATCH | TRANSDERMAL | 3 refills | Status: DC
  Filled 2022-05-18: qty 8, 28d supply, fill #0
  Filled 2022-06-12: qty 8, 28d supply, fill #1

## 2022-05-19 ENCOUNTER — Other Ambulatory Visit (HOSPITAL_COMMUNITY): Payer: Self-pay

## 2022-05-19 ENCOUNTER — Ambulatory Visit: Payer: 59 | Admitting: Adult Health

## 2022-05-20 DIAGNOSIS — R030 Elevated blood-pressure reading, without diagnosis of hypertension: Secondary | ICD-10-CM | POA: Diagnosis not present

## 2022-05-20 DIAGNOSIS — R350 Frequency of micturition: Secondary | ICD-10-CM | POA: Diagnosis not present

## 2022-05-20 DIAGNOSIS — Z6825 Body mass index (BMI) 25.0-25.9, adult: Secondary | ICD-10-CM | POA: Diagnosis not present

## 2022-05-20 DIAGNOSIS — N719 Inflammatory disease of uterus, unspecified: Secondary | ICD-10-CM | POA: Diagnosis not present

## 2022-05-21 DIAGNOSIS — Z6826 Body mass index (BMI) 26.0-26.9, adult: Secondary | ICD-10-CM | POA: Diagnosis not present

## 2022-05-21 DIAGNOSIS — R232 Flushing: Secondary | ICD-10-CM | POA: Diagnosis not present

## 2022-05-21 DIAGNOSIS — E039 Hypothyroidism, unspecified: Secondary | ICD-10-CM | POA: Diagnosis not present

## 2022-05-21 DIAGNOSIS — N951 Menopausal and female climacteric states: Secondary | ICD-10-CM | POA: Diagnosis not present

## 2022-05-21 DIAGNOSIS — G479 Sleep disorder, unspecified: Secondary | ICD-10-CM | POA: Diagnosis not present

## 2022-06-12 ENCOUNTER — Other Ambulatory Visit (HOSPITAL_COMMUNITY): Payer: Self-pay

## 2022-06-15 ENCOUNTER — Ambulatory Visit (INDEPENDENT_AMBULATORY_CARE_PROVIDER_SITE_OTHER): Payer: 59 | Admitting: Obstetrics & Gynecology

## 2022-06-15 ENCOUNTER — Encounter: Payer: Self-pay | Admitting: Obstetrics & Gynecology

## 2022-06-15 ENCOUNTER — Other Ambulatory Visit (HOSPITAL_COMMUNITY)
Admission: RE | Admit: 2022-06-15 | Discharge: 2022-06-15 | Disposition: A | Discharge status: Home / Self Care | Payer: 59 | Source: Ambulatory Visit | Attending: Obstetrics & Gynecology | Admitting: Obstetrics & Gynecology

## 2022-06-15 ENCOUNTER — Other Ambulatory Visit (HOSPITAL_COMMUNITY): Payer: Self-pay

## 2022-06-15 VITALS — BP 108/64 | HR 51 | Ht 61.0 in | Wt 140.0 lb

## 2022-06-15 DIAGNOSIS — Z01419 Encounter for gynecological examination (general) (routine) without abnormal findings: Secondary | ICD-10-CM | POA: Insufficient documentation

## 2022-06-15 DIAGNOSIS — N951 Menopausal and female climacteric states: Secondary | ICD-10-CM

## 2022-06-15 MED ORDER — ESTRADIOL 0.1 MG/24HR TD PTTW
1.0000 | MEDICATED_PATCH | TRANSDERMAL | 12 refills | Status: DC
  Filled 2022-06-15: qty 8, 28d supply, fill #0
  Filled 2022-07-09: qty 8, 28d supply, fill #1
  Filled 2022-08-08: qty 8, 28d supply, fill #2
  Filled 2022-09-05: qty 8, 28d supply, fill #3
  Filled 2022-10-01: qty 8, 28d supply, fill #4
  Filled 2022-10-30: qty 8, 28d supply, fill #5
  Filled 2022-11-27: qty 8, 28d supply, fill #6
  Filled 2022-12-26: qty 8, 28d supply, fill #7
  Filled 2023-01-24: qty 8, 28d supply, fill #8
  Filled 2023-02-23: qty 8, 28d supply, fill #9
  Filled 2023-03-21: qty 8, 28d supply, fill #10
  Filled 2023-04-27: qty 8, 28d supply, fill #11
  Filled 2023-05-21: qty 8, 28d supply, fill #12

## 2022-06-15 NOTE — Progress Notes (Signed)
Subjective:     Meghan Murray is a 51 y.o. female here for a routine exam.  No LMP recorded. (Menstrual status: IUD). [id number] Birth Control Method:  IUD/menopause Menstrual Calendar(currently): amenorrhea  Current complaints: still with vasomotor symtpms.   Current acute medical issues:  none   Recent Gynecologic History No LMP recorded. (Menstrual status: IUD). Last Pap: 11/27/19,  normal Last mammogram: 3/23,  normal  Past Medical History:  Diagnosis Date   Depression    Depression (emotion) 10/30/2015   GAD (generalized anxiety disorder)    Hot flashes 10/30/2015   IUD (intrauterine device) in place 04/16/2016   Lymph nodes enlarged 04/16/2016   Right under arm had CT and has seen Dr Jeanella Cara 10/30/2015   PONV (postoperative nausea and vomiting)    Swollen lymph nodes    has had CT and saw Dr Rush Farmer    Past Surgical History:  Procedure Laterality Date   ABDOMINOPLASTY     CESAREAN SECTION     x2   SHOULDER ARTHROSCOPY WITH OPEN ROTATOR CUFF REPAIR Right 07/08/2020   Procedure: SHOULDER ARTHROSCOPY WITH OPEN ROTATOR CUFF REPAIR;  Surgeon: Marybelle Killings, MD;  Location: [place];  Service: Orthopedics;  Laterality: Right;    OB History     Gravida  2   Para  2   Term  2   Preterm      AB      Living  2      SAB      IAB      Ectopic      Multiple      Live Births  2           Social History   Socioeconomic History   Marital status: Married    Spouse name: Not on file   Number of children: Not on file   Years of education: Not on file   Highest education level: Not on file  Occupational History   Not on file  Tobacco Use   Smoking status: Never   Smokeless tobacco: Never  Vaping Use   Vaping Use: Never used  Substance and Sexual Activity   Alcohol use: No   Drug use: No   Sexual activity: Yes    Birth control/protection: I.U.D.  Other Topics Concern   Not on file  Social History Narrative   Not on file    Social Determinants of Health   Financial Resource Strain: Low Risk  (01/20/2021)   Overall Financial Resource Strain (CARDIA)    Difficulty of Paying Living Expenses: Not hard at all  Food Insecurity: No Food Insecurity (06/15/2022)   Hunger Vital Sign    Worried About Running Out of Food in the Last Year: Never true    Ran Out of Food in the Last Year: Never true  Transportation Needs: No Transportation Needs (06/15/2022)   PRAPARE - Hydrologist (Medical): No    Lack of Transportation (Non-Medical): No  Physical Activity: Unknown (06/15/2022)   Exercise Vital Sign    Days of Exercise per Week: 5 days    Minutes of Exercise per Session: Not on file  Stress: Stress Concern Present (06/15/2022)   [place]    Feeling of Stress : To some extent  Social Connections: Moderately Isolated (06/15/2022)   Social Connection and Isolation Panel [NHANES]    Frequency of Communication with Friends and Family: More  than three times a week    Frequency of Social Gatherings with Friends and Family: Twice a week    Attends Religious Services: 1 to 4 times per year    Active Member of [employer] or Organizations: No    Attends Archivist Meetings: Never    Marital Status: Widowed    Family History  Problem Relation Age of Onset   Cancer Mother        breast   Breast cancer Mother    Depression Mother    Hypertension Father    Cancer Maternal Grandmother        breast   Breast cancer Maternal Grandmother    Heart attack Maternal Grandfather    Other Paternal Grandfather        died in MVA     Current Outpatient Medications:    citalopram (CELEXA) 10 MG tablet, , Disp: , Rfl:    estradiol (VIVELLE-DOT) 0.1 MG/24HR patch, Place 1 patch (0.1 mg total) onto the skin 2 (two) times a week., Disp: 8 patch, Rfl: 12   IRON PO, Take 50 mg by mouth daily., Disp: , Rfl:    levonorgestrel (MIRENA) 20  MCG/24HR IUD, 1 each by Intrauterine route once., Disp: , Rfl:    PROGESTERONE PO, Take 50 mg by mouth daily., Disp: , Rfl:    TESTOSTERONE TD, Place onto the skin., Disp: , Rfl:    VITAMIN D PO, Take by mouth daily., Disp: , Rfl:   Review of Systems  Review of Systems  Constitutional: Negative for fever, chills, weight loss, malaise/fatigue and diaphoresis.  HENT: Negative for hearing loss, ear pain, nosebleeds, congestion, sore throat, neck pain, tinnitus and ear discharge.   Eyes: Negative for blurred vision, double vision, photophobia, pain, discharge and redness.  Respiratory: Negative for cough, hemoptysis, sputum production, shortness of breath, wheezing and stridor.   Cardiovascular: Negative for chest pain, palpitations, orthopnea, claudication, leg swelling and PND.  Gastrointestinal: negative for abdominal pain. Negative for heartburn, nausea, vomiting, diarrhea, constipation, blood in stool and melena.  Genitourinary: Negative for dysuria, urgency, frequency, hematuria and flank pain.  Musculoskeletal: Negative for myalgias, back pain, joint pain and falls.  Skin: Negative for itching and rash.  Neurological: Negative for dizziness, tingling, tremors, sensory change, speech change, focal weakness, seizures, loss of consciousness, weakness and headaches.  Endo/Heme/Allergies: Negative for environmental allergies and polydipsia. Does not bruise/bleed easily.  Psychiatric/Behavioral: Negative for depression, suicidal ideas, hallucinations, memory loss and substance abuse. The patient is not nervous/anxious and does not have insomnia.        Objective:  Blood pressure 108/64, pulse (!) 51, height '5\' 1"'$  (1.549 m), weight 140 lb (63.5 kg).   Physical Exam  Vitals reviewed. Constitutional: She is oriented to person, place, and time. She appears well-developed and well-nourished.  HENT:  Head: Normocephalic and atraumatic.        Right Ear: External ear normal.  Left Ear: External  ear normal.  Nose: Nose normal.  Mouth/Throat: Oropharynx is clear and moist.  Eyes: Conjunctivae and EOM are normal. Pupils are equal, round, and reactive to light. Right eye exhibits no discharge. Left eye exhibits no discharge. No scleral icterus.  Neck: Normal range of motion. Neck supple. No tracheal deviation present. No thyromegaly present.  Cardiovascular: Normal rate, regular rhythm, normal heart sounds and intact distal pulses.  Exam reveals no gallop and no friction rub.   No murmur heard. Respiratory: Effort normal and breath sounds normal. No respiratory distress. She has  no wheezes. She has no rales. She exhibits no tenderness.  GI: Soft. Bowel sounds are normal. She exhibits no distension and no mass. There is no tenderness. There is no rebound and no guarding.  Genitourinary:  Breasts no masses skin changes or nipple changes bilaterally      Vulva is normal without lesions Vagina is pink moist without discharge Cervix normal in appearance and pap is done Uterus is normal size shape and contour Adnexa is negative with normal sized ovaries   Musculoskeletal: Normal range of motion. She exhibits no edema and no tenderness.  Neurological: She is alert and oriented to person, place, and time. She has normal reflexes. She displays normal reflexes. No cranial nerve deficit. She exhibits normal muscle tone. Coordination normal.  Skin: Skin is warm and dry. No rash noted. No erythema. No pallor.  Psychiatric: She has a normal mood and affect. Her behavior is normal. Judgment and thought content normal.       Medications Ordered at today's visit: Meds ordered this encounter  Medications   estradiol (VIVELLE-DOT) 0.1 MG/24HR patch    Sig: Place 1 patch (0.1 mg total) onto the skin 2 (two) times a week.    Dispense:  8 patch    Refill:  12    Other orders placed at today's visit: No orders of the defined types were placed in this encounter.     Assessment:    Normal Gyn  exam.   Menopausal vasomotor symtoms Plan:    Titrate vivelle dot ^0.1 mg  Recommend IUD removal in 3 months with progesterone 50 mg compunded May need adjustment of progesterone when IUD is removed Recommend removal due to recurrent endometritis    Return in about 3 months (around 09/15/2022) for Follow up, with Dr Elonda Husky: IUD removal.

## 2022-06-16 LAB — CYTOLOGY - PAP
Comment: NEGATIVE
Diagnosis: NEGATIVE
High risk HPV: NEGATIVE

## 2022-06-19 ENCOUNTER — Other Ambulatory Visit (HOSPITAL_COMMUNITY): Payer: Self-pay

## 2022-06-22 DIAGNOSIS — I83893 Varicose veins of bilateral lower extremities with other complications: Secondary | ICD-10-CM | POA: Diagnosis not present

## 2022-06-22 DIAGNOSIS — I872 Venous insufficiency (chronic) (peripheral): Secondary | ICD-10-CM | POA: Diagnosis not present

## 2022-07-09 ENCOUNTER — Other Ambulatory Visit (HOSPITAL_COMMUNITY): Payer: Self-pay

## 2022-07-20 DIAGNOSIS — I872 Venous insufficiency (chronic) (peripheral): Secondary | ICD-10-CM | POA: Diagnosis not present

## 2022-07-23 ENCOUNTER — Ambulatory Visit (HOSPITAL_COMMUNITY)
Admission: RE | Admit: 2022-07-23 | Discharge: 2022-07-23 | Disposition: A | Discharge status: Home / Self Care | Payer: 59 | Source: Ambulatory Visit | Attending: Physician Assistant | Admitting: Physician Assistant

## 2022-07-23 ENCOUNTER — Other Ambulatory Visit (HOSPITAL_COMMUNITY): Payer: Self-pay | Admitting: Physician Assistant

## 2022-07-23 DIAGNOSIS — R030 Elevated blood-pressure reading, without diagnosis of hypertension: Secondary | ICD-10-CM | POA: Diagnosis not present

## 2022-07-23 DIAGNOSIS — Z6826 Body mass index (BMI) 26.0-26.9, adult: Secondary | ICD-10-CM | POA: Diagnosis not present

## 2022-07-23 DIAGNOSIS — N8302 Follicular cyst of left ovary: Secondary | ICD-10-CM | POA: Diagnosis not present

## 2022-07-23 DIAGNOSIS — R1032 Left lower quadrant pain: Secondary | ICD-10-CM

## 2022-07-24 ENCOUNTER — Other Ambulatory Visit (HOSPITAL_COMMUNITY): Payer: Self-pay | Admitting: Physician Assistant

## 2022-07-24 DIAGNOSIS — R1032 Left lower quadrant pain: Secondary | ICD-10-CM

## 2022-08-04 ENCOUNTER — Ambulatory Visit (HOSPITAL_COMMUNITY)
Admission: RE | Admit: 2022-08-04 | Discharge: 2022-08-04 | Disposition: A | Discharge status: Home / Self Care | Payer: 59 | Source: Ambulatory Visit | Attending: Physician Assistant | Admitting: Physician Assistant

## 2022-08-04 DIAGNOSIS — N281 Cyst of kidney, acquired: Secondary | ICD-10-CM | POA: Diagnosis not present

## 2022-08-04 DIAGNOSIS — R1032 Left lower quadrant pain: Secondary | ICD-10-CM | POA: Insufficient documentation

## 2022-08-04 MED ORDER — IOHEXOL 300 MG/ML  SOLN
100.0000 mL | Freq: Once | INTRAMUSCULAR | Status: AC | PRN
  Administered 2022-08-04: 100 mL via INTRAVENOUS

## 2022-08-04 MED ORDER — SODIUM CHLORIDE (PF) 0.9 % IJ SOLN
INTRAMUSCULAR | Status: AC
  Filled 2022-08-04: qty 50

## 2022-08-10 ENCOUNTER — Other Ambulatory Visit (HOSPITAL_COMMUNITY): Payer: Self-pay

## 2022-08-10 DIAGNOSIS — R030 Elevated blood-pressure reading, without diagnosis of hypertension: Secondary | ICD-10-CM | POA: Diagnosis not present

## 2022-08-10 DIAGNOSIS — F329 Major depressive disorder, single episode, unspecified: Secondary | ICD-10-CM | POA: Diagnosis not present

## 2022-08-10 DIAGNOSIS — Z1331 Encounter for screening for depression: Secondary | ICD-10-CM | POA: Diagnosis not present

## 2022-08-10 DIAGNOSIS — Z6827 Body mass index (BMI) 27.0-27.9, adult: Secondary | ICD-10-CM | POA: Diagnosis not present

## 2022-08-10 DIAGNOSIS — Z1389 Encounter for screening for other disorder: Secondary | ICD-10-CM | POA: Diagnosis not present

## 2022-08-11 ENCOUNTER — Encounter (INDEPENDENT_AMBULATORY_CARE_PROVIDER_SITE_OTHER): Payer: Self-pay | Admitting: *Deleted

## 2022-08-12 ENCOUNTER — Ambulatory Visit: Payer: 59 | Admitting: Adult Health

## 2022-08-12 ENCOUNTER — Encounter: Payer: Self-pay | Admitting: Adult Health

## 2022-08-12 VITALS — BP 102/63 | HR 54 | Ht 61.0 in | Wt 147.0 lb

## 2022-08-12 DIAGNOSIS — N8302 Follicular cyst of left ovary: Secondary | ICD-10-CM | POA: Diagnosis not present

## 2022-08-12 DIAGNOSIS — Z975 Presence of (intrauterine) contraceptive device: Secondary | ICD-10-CM

## 2022-08-12 DIAGNOSIS — R1032 Left lower quadrant pain: Secondary | ICD-10-CM | POA: Diagnosis not present

## 2022-08-12 NOTE — Progress Notes (Signed)
  Subjective:     Patient ID: Meghan Murray, female   DOB: Feb 16, 1971, 51 y.o.   MRN: [id number]  HPI Meghan Murray is a 51 year old white female, widowed, G2P2 in complaining of  pain LLQ, for about 2 months, feels throbbing and burning and  some low back discomfort on left too.She saw PCP and had CT and [place]. Small follicular cyst on [place], not seen on CT and Constipation on CT.to see GI.  Last pap was negative HPV and malignancy 06/15/22  PCP is Aggie Hacker PA  Review of Systems + pain LLQ, for about 2 months, feels throbbing and burning and  some low back discomfort on left too   Reviewed past medical,surgical, social and family history. Reviewed medications and allergies.  Objective:   Physical Exam BP 102/63 (BP Location: Left Arm, Patient Position: Sitting, Cuff Size: Normal)   Pulse (!) 54   Ht '5\' 1"'$  (1.549 m)   Wt 147 lb (66.7 kg)   BMI 27.78 kg/m     Skin warm and dry.Pelvic: external genitalia is normal in appearance no lesions, vagina:is pink,urethra has no lesions or masses noted, cervix:smooth, ?IUD string, uterus: normal size, shape and contour, non tender, no masses felt, adnexa: no masses, left mid quadrant tenderness noted. Bladder is non tender and no masses felt.  Fall risk is low  Upstream - 08/12/22 0908       Pregnancy Intention Screening   Does the patient want to become pregnant in the next year? No    Does the patient's partner want to become pregnant in the next year? No    Would the patient like to discuss contraceptive options today? No      Contraception Wrap Up   Current Method IUD or IUS    End Method IUD or IUS    Contraception Counseling Provided No            Examination chaperoned by Levy Pupa LPN  Assessment:     1. LLQ pain Has pain LLQ, for about 2 months, feels throbbing and burning and  some low back discomfort on left too To see GI But talk with PCP may be back related since burning sensation, not GYN related   2. Follicular cyst of  left ovary Has 1.9 cm follicular ovarian cyst seen on [place] 07/23/22,no follow up needed  Had CT 08/04/22, no mention of cyst, + constipation   3. IUD (intrauterine device) in place Mirena placed 11/28/2018, in place on CT and [place]    Plan:     Follow up prn

## 2022-08-24 DIAGNOSIS — N951 Menopausal and female climacteric states: Secondary | ICD-10-CM | POA: Diagnosis not present

## 2022-08-24 DIAGNOSIS — E039 Hypothyroidism, unspecified: Secondary | ICD-10-CM | POA: Diagnosis not present

## 2022-08-26 DIAGNOSIS — R61 Generalized hyperhidrosis: Secondary | ICD-10-CM | POA: Diagnosis not present

## 2022-08-26 DIAGNOSIS — N951 Menopausal and female climacteric states: Secondary | ICD-10-CM | POA: Diagnosis not present

## 2022-08-26 DIAGNOSIS — R454 Irritability and anger: Secondary | ICD-10-CM | POA: Diagnosis not present

## 2022-08-26 DIAGNOSIS — E039 Hypothyroidism, unspecified: Secondary | ICD-10-CM | POA: Diagnosis not present

## 2022-08-26 DIAGNOSIS — R232 Flushing: Secondary | ICD-10-CM | POA: Diagnosis not present

## 2022-08-26 DIAGNOSIS — Z79890 Hormone replacement therapy (postmenopausal): Secondary | ICD-10-CM | POA: Diagnosis not present

## 2022-08-26 DIAGNOSIS — Z6828 Body mass index (BMI) 28.0-28.9, adult: Secondary | ICD-10-CM | POA: Diagnosis not present

## 2022-08-27 ENCOUNTER — Telehealth (INDEPENDENT_AMBULATORY_CARE_PROVIDER_SITE_OTHER): Payer: Self-pay | Admitting: *Deleted

## 2022-08-27 NOTE — Telephone Encounter (Signed)
Any room

## 2022-08-27 NOTE — Telephone Encounter (Signed)
Referring MD/PCP: Marcelle Overlie, PA  Procedure: Colonoscopy  Reason/Indication:  screening  Has patient had this procedure before?  no  If so, when, by whom and where?    Is there a family history of colon cancer?  no  Who?  What age when diagnosed?    Is patient diabetic? If yes, Type 1 or Type 2   no      Does patient have prosthetic heart valve or mechanical valve?  no  Do you have a pacemaker/defibrillator?  no  Has patient ever had endocarditis/atrial fibrillation? no  Does patient use oxygen? no  Has patient had joint replacement within last 12 months?  no  Is patient constipated or do they take laxatives? no  Does patient have a history of alcohol/drug use?  no  Have you had a stroke/heart attack last 6 mths? no  Do you take medicine for weight loss?  no  For female patients,: have you had a hysterectomy no                      are you post menopausal no                      do you still have your menstrual cycle no, has IUD  Is patient on blood thinner such as Coumadin, Plavix and/or Aspirin? no  Medications:  Current Outpatient Medications on File Prior to Visit  Medication Sig Dispense Refill   citalopram (CELEXA) 10 MG tablet      estradiol (VIVELLE-DOT) 0.1 MG/24HR patch Place 1 patch (0.1 mg total) onto the skin 2 (two) times a week. 8 patch 12   Omega-3 Fatty Acids (FISH OIL PO) Take by mouth.     PROGESTERONE PO Take 50 mg by mouth daily.     VITAMIN D PO Take by mouth daily. Plus k2 5000units     levonorgestrel (MIRENA) 20 MCG/24HR IUD 1 each by Intrauterine route once.     No current facility-administered medications on file prior to visit.     Allergies:  Allergies  Allergen Reactions   Flagyl [Metronidazole]    Sulfa Antibiotics      [facility] in [place]

## 2022-08-28 NOTE — Telephone Encounter (Signed)
LMOVM to call back 

## 2022-09-08 ENCOUNTER — Other Ambulatory Visit (HOSPITAL_COMMUNITY): Payer: Self-pay

## 2022-09-10 ENCOUNTER — Encounter: Payer: Self-pay | Admitting: *Deleted

## 2022-09-10 DIAGNOSIS — R030 Elevated blood-pressure reading, without diagnosis of hypertension: Secondary | ICD-10-CM | POA: Diagnosis not present

## 2022-09-10 DIAGNOSIS — G8929 Other chronic pain: Secondary | ICD-10-CM | POA: Diagnosis not present

## 2022-09-10 DIAGNOSIS — Z6828 Body mass index (BMI) 28.0-28.9, adult: Secondary | ICD-10-CM | POA: Diagnosis not present

## 2022-09-10 DIAGNOSIS — N719 Inflammatory disease of uterus, unspecified: Secondary | ICD-10-CM | POA: Diagnosis not present

## 2022-09-10 DIAGNOSIS — R1032 Left lower quadrant pain: Secondary | ICD-10-CM | POA: Diagnosis not present

## 2022-09-10 NOTE — Telephone Encounter (Signed)
Letter mailed

## 2022-09-15 ENCOUNTER — Ambulatory Visit: Payer: Commercial Managed Care - PPO | Admitting: Obstetrics & Gynecology

## 2022-09-22 ENCOUNTER — Other Ambulatory Visit (HOSPITAL_COMMUNITY): Payer: Self-pay | Admitting: Family Medicine

## 2022-09-22 DIAGNOSIS — R1032 Left lower quadrant pain: Secondary | ICD-10-CM

## 2022-10-12 ENCOUNTER — Ambulatory Visit (HOSPITAL_COMMUNITY)
Admission: RE | Admit: 2022-10-12 | Discharge: 2022-10-12 | Disposition: A | Discharge status: Home / Self Care | Payer: Commercial Managed Care - PPO | Source: Ambulatory Visit | Attending: Family Medicine | Admitting: Family Medicine

## 2022-10-12 DIAGNOSIS — R1032 Left lower quadrant pain: Secondary | ICD-10-CM | POA: Insufficient documentation

## 2022-10-12 MED ORDER — GADOBUTROL 1 MMOL/ML IV SOLN
7.0000 mL | Freq: Once | INTRAVENOUS | Status: AC | PRN
  Administered 2022-10-12: 7 mL via INTRAVENOUS

## 2022-10-13 ENCOUNTER — Ambulatory Visit (INDEPENDENT_AMBULATORY_CARE_PROVIDER_SITE_OTHER): Payer: Commercial Managed Care - PPO | Admitting: Gastroenterology

## 2022-10-13 ENCOUNTER — Encounter: Payer: Self-pay | Admitting: Gastroenterology

## 2022-10-13 VITALS — BP 115/70 | HR 52 | Temp 98.1°F | Ht 61.0 in | Wt 157.0 lb

## 2022-10-13 DIAGNOSIS — Z1211 Encounter for screening for malignant neoplasm of colon: Secondary | ICD-10-CM

## 2022-10-13 DIAGNOSIS — R1032 Left lower quadrant pain: Secondary | ICD-10-CM

## 2022-10-13 NOTE — H&P (View-Only) (Signed)
GI Office Note    Referring Provider: Lavella Lemons, PA Primary Care Physician:  Lavella Lemons, PA  Primary Gastroenterologist: Elon Alas. Abbey Chatters, DO   Chief Complaint   Chief Complaint  Patient presents with   Abdominal Pain    Left lower quadrant pain. Colonoscopy.      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   Patient developed LLQ pain back in 06/2022. At first she thought she may have diverticulitis or gyn issue. Noted pain LLQ sometimes up into LUQ. Worse with prolonged sitting. Has been constant but sometimes more severe. PCP initially arranged for pelvic U/S 07/23/22 which showed 1.9cm left ovarian follicular cyst. She had CT A/P with contrast 08/06/22 that showed no adnexal masses, large amount of stool throughout the colon. She then saw gyn who did not think her LLQ pain with gyn in origin. MRI Abd with and without contrast 10/12/22 showed 64m small soft tissue nodule along distal aspect of pancreatic tail and inferior medial aspect of spleen noted since 2017, c/w splenule. She had large volume formed stool in colon.   She does not feel constipation. She has daily BM. When she first noted findings of large amount of stool on imaging, she tried sennakot and miralax for couple of weeks. She did have increased stools but did not note improvement in symptoms. She has intermittent low back pain. LLQ pain always present. Does not seem to be related to bowels. Not worse with movement or lifting, she does crossfit and does not seem to aggravate LLQ pain. Pain is never more than 5 out of 10 on pain scale. Her weight is up 17 pounds since 06/2022 and she notes her winter weight typically goes up but she loses in the spring/summer. [facility] uses Cytomel for thyroid, testosterone pellets for years. She has history of intermittent endometritis, from her IUD, and uses  doxycycline as needed.   Medications   Current Outpatient Medications  Medication Sig Dispense Refill   citalopram (CELEXA) 10 MG tablet      estradiol (VIVELLE-DOT) 0.1 MG/24HR patch Place 1 patch (0.1 mg total) onto the skin 2 (two) times a week. 8 patch 12   levonorgestrel (MIRENA) 20 MCG/24HR IUD 1 each by Intrauterine route once.     liothyronine (CYTOMEL) 5 MCG tablet 3 tablets Orally Once a day for 90 days     Omega-3 Fatty Acids (FISH OIL PO) Take by mouth.     PROGESTERONE PO Take 50 mg by mouth daily.     VITAMIN D PO Take by mouth daily. Plus k2 5000units     No current facility-administered medications for this visit.    Allergies   Allergies as of 10/13/2022 - Review Complete 10/13/2022  Allergen Reaction Noted   Flagyl [metronidazole]  03/14/2013   Sulfa antibiotics  03/14/2013    Past Medical History   Past Medical History:  Diagnosis Date   Depression    Depression (emotion) 10/30/2015   GAD (generalized anxiety disorder)    Hot flashes 10/30/2015   IUD (intrauterine device) in place 04/16/2016   Lymph nodes enlarged 04/16/2016   Right under arm had CT and has seen Dr BJeanella Cara2/15/2017   PONV (postoperative nausea and vomiting)    Swollen lymph nodes    has had CT and saw Dr BRush Farmer  Past Surgical History   Past Surgical History:  Procedure Laterality Date   ABDOMINOPLASTY  2005   CESAREAN SECTION     x2   SHOULDER ARTHROSCOPY WITH OPEN ROTATOR CUFF REPAIR Right 07/08/2020   Procedure: SHOULDER ARTHROSCOPY WITH OPEN ROTATOR CUFF REPAIR;  Surgeon: Marybelle Killings, MD;  Location: [place];  Service: Orthopedics;  Laterality: Right;    Past Family History   Family History  Problem Relation Age of Onset   Cancer Mother        breast   Breast cancer Mother    Depression Mother    Hypertension Father    Cancer Maternal Grandmother        breast   Breast cancer Maternal Grandmother    Heart attack Maternal Grandfather     Other Paternal Grandfather        died in MVA   Colon cancer Neg Hx    Pancreatic cancer Neg Hx     Past Social History   Social History   Socioeconomic History   Marital status: Widowed    Spouse name: Not on file   Number of children: Not on file   Years of education: Not on file   Highest education level: Not on file  Occupational History   Not on file  Tobacco Use   Smoking status: Never   Smokeless tobacco: Never  Vaping Use   Vaping Use: Never used  Substance and Sexual Activity   Alcohol use: No   Drug use: No   Sexual activity: Yes    Birth control/protection: I.U.D.  Other Topics Concern   Not on file  Social History Narrative   Not on file   Social Determinants of Health   Financial Resource Strain: Low Risk  (01/20/2021)   Overall Financial Resource Strain (CARDIA)    Difficulty of Paying Living Expenses: Not hard at all  Food Insecurity: No Food Insecurity (06/15/2022)   Hunger Vital Sign    Worried About Running Out of Food in the Last Year: Never true    Ran Out of Food in the Last Year: Never true  Transportation Needs: No Transportation Needs (06/15/2022)   PRAPARE - Hydrologist (Medical): No    Lack of Transportation (Non-Medical): No  Physical Activity: Unknown (06/15/2022)   Exercise Vital Sign    Days of Exercise per Week: 5 days    Minutes of Exercise per Session: Not on file  Stress: Stress Concern Present (06/15/2022)   [place]    Feeling of Stress : To some extent  Social Connections: Moderately Isolated (06/15/2022)   Social Connection and Isolation Panel [NHANES]    Frequency of Communication with Friends and Family: More than three times a week    Frequency of Social Gatherings with Friends and Family: Twice a week    Attends Religious Services: 1 to 4 times per year    Active Member of [employer] or Organizations: No    Attends Theatre manager Meetings: Never    Marital Status: Widowed  Intimate Partner Violence: Not At Risk (06/15/2022)   Humiliation, Afraid, Rape, and Kick questionnaire    Fear of Current or Ex-Partner: No    Emotionally Abused: No    Physically Abused: No    Sexually Abused: No    Review of Systems   General: Negative for anorexia, weight loss, fever, chills, fatigue, weakness. Eyes: Negative for vision changes.  ENT: Negative for hoarseness, difficulty swallowing , nasal congestion. CV: Negative for chest pain, angina, palpitations, dyspnea on exertion, peripheral edema.  Respiratory: Negative for dyspnea at rest, dyspnea on exertion, cough, sputum, wheezing.  GI: See history of present illness. GU:  Negative for dysuria, hematuria, urinary incontinence, urinary frequency, nocturnal urination.  MS: Negative for joint pain, +low back pain.  Derm: Negative for rash or itching.  Neuro: Negative for weakness, abnormal sensation, seizure, frequent headaches, memory loss,  confusion.  Psych: Negative for anxiety, depression, suicidal ideation, hallucinations.  Endo: Negative for unusual weight change.  Heme: Negative for bruising or bleeding. Allergy: Negative for rash or hives.  Physical Exam   BP 115/70 (BP Location: Right Arm, Patient Position: Sitting, Cuff Size: Normal)   Pulse (!) 52   Temp 98.1 F (36.7 C) (Oral)   Ht '5\' 1"'$  (1.549 m)   Wt 157 lb (71.2 kg)   SpO2 97%   BMI 29.66 kg/m    General: Well-nourished, well-developed in no acute distress.  Head: Normocephalic, atraumatic.   Eyes: Conjunctiva pink, no icterus. Mouth: Oropharyngeal mucosa moist and pink   Neck: Supple without thyromegaly, masses, or lymphadenopathy.  Lungs: Clear to auscultation bilaterally.  Heart: Regular rate and rhythm, no murmurs rubs or gallops.  Abdomen: Bowel sounds are normal, nontender, nondistended, no hepatosplenomegaly or masses,  no abdominal bruits or hernia, no rebound or guarding.   Straight leg raise without tenderness Rectal: not performed Extremities: No lower extremity edema. No clubbing or deformities.  Neuro: Alert and oriented x 4 , grossly normal neurologically.  Skin: Warm and dry, no rash or jaundice.   Psych: Alert and cooperative, normal mood and affect.  Labs   None  Imaging Studies   MR ABDOMEN WWO CONTRAST  Result Date: 10/12/2022 CLINICAL DATA:  Left lower quadrant abdominal pain. EXAM: MRI ABDOMEN WITHOUT AND WITH CONTRAST TECHNIQUE: Multiplanar multisequence MR imaging of the abdomen was performed both before and after the administration of intravenous contrast. CONTRAST:  58m GADAVIST GADOBUTROL 1 MMOL/ML IV SOLN COMPARISON:  CT abdomen pelvis August 04, 2022. FINDINGS: Lower chest: No acute findings. Hepatobiliary: No significant hepatic steatosis. No suspicious hepatic lesion. Gallbladder is unremarkable. No biliary ductal dilation. Pancreas: No pancreatic ductal dilation or evidence of acute inflammation. Intrinsic T1 signal of the pancreatic parenchyma is within normal limits homogeneous enhancement of the pancreas postcontrast administration Small soft tissue nodule along the distal aspect of the pancreatic tail and inferior medial aspect of the spleen on image 6/7 measures 16 mm stable dating back to chest CT December 12, 2015 and following splenic signal on all pulse sequences compatible with a splenule. Spleen:  Normal size spleen. Adrenals/Urinary Tract: Bilateral adrenal glands appear normal. No hydronephrosis. Benign 2 cm right renal cyst on image 15/2 requires no imaging follow-up. Additional tiny bilateral fluid density renal lesions are technically too small to accurately characterize but statistically likely reflect benign cysts and requiring no independent imaging follow-up Stomach/Bowel: Large volume of formed stool throughout the colon. Vascular/Lymphatic: Normal caliber abdominal aorta. Smooth IVC contours. Portal, splenic and superior  mesenteric veins are patent. No pathologically enlarged abdominal lymph nodes. Other:  No significant abdominal free fluid. Musculoskeletal: No suspicious osseous lesion. IMPRESSION: 1. No acute findings in the abdomen. 2. Large volume of formed stool throughout the colon. Correlate with any clinical signs of constipation. Electronically Signed   By: JDahlia BailiffM.D.   On: 10/12/2022 16:05    Assessment   LLQ pain: extensive work up.  May be referred pain. Her abdominal exam is benign today. Two studies showing large volume stool, patient reports daily BMs and does not get relief of abdominal pain with increased stools. Gyn evaluation unremarkable.   Screening colonoscopy: first ever colonoscopy. Will use Linzess starting one week prior to procedure to augment bowel prep.    PLAN   Colonoscopy in near future. ASA 2.  I have discussed the risks, alternatives, benefits with regards to but not limited to the risk of reaction to medication, bleeding, infection, perforation and the patient is agreeable to proceed. Written consent to be obtained.    Laureen Ochs. Bobby Rumpf, [place], [facility]

## 2022-10-13 NOTE — Progress Notes (Signed)
GI Office Note    Referring Provider: Lavella Lemons, PA Primary Care Physician:  Lavella Lemons, PA  Primary Gastroenterologist: Elon Alas. Abbey Chatters, DO   Chief Complaint   Chief Complaint  Patient presents with   Abdominal Pain    Left lower quadrant pain. Colonoscopy.      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   Patient developed LLQ pain back in 06/2022. At first she thought she may have diverticulitis or gyn issue. Noted pain LLQ sometimes up into LUQ. Worse with prolonged sitting. Has been constant but sometimes more severe. PCP initially arranged for pelvic U/S 07/23/22 which showed 1.9cm left ovarian follicular cyst. She had CT A/P with contrast 08/06/22 that showed no adnexal masses, large amount of stool throughout the colon. She then saw gyn who did not think her LLQ pain with gyn in origin. MRI Abd with and without contrast 10/12/22 showed 64m small soft tissue nodule along distal aspect of pancreatic tail and inferior medial aspect of spleen noted since 2017, c/w splenule. She had large volume formed stool in colon.   She does not feel constipation. She has daily BM. When she first noted findings of large amount of stool on imaging, she tried sennakot and miralax for couple of weeks. She did have increased stools but did not note improvement in symptoms. She has intermittent low back pain. LLQ pain always present. Does not seem to be related to bowels. Not worse with movement or lifting, she does crossfit and does not seem to aggravate LLQ pain. Pain is never more than 5 out of 10 on pain scale. Her weight is up 17 pounds since 06/2022 and she notes her winter weight typically goes up but she loses in the spring/summer. [facility] uses Cytomel for thyroid, testosterone pellets for years. She has history of intermittent endometritis, from her IUD, and uses  doxycycline as needed.   Medications   Current Outpatient Medications  Medication Sig Dispense Refill   citalopram (CELEXA) 10 MG tablet      estradiol (VIVELLE-DOT) 0.1 MG/24HR patch Place 1 patch (0.1 mg total) onto the skin 2 (two) times a week. 8 patch 12   levonorgestrel (MIRENA) 20 MCG/24HR IUD 1 each by Intrauterine route once.     liothyronine (CYTOMEL) 5 MCG tablet 3 tablets Orally Once a day for 90 days     Omega-3 Fatty Acids (FISH OIL PO) Take by mouth.     PROGESTERONE PO Take 50 mg by mouth daily.     VITAMIN D PO Take by mouth daily. Plus k2 5000units     No current facility-administered medications for this visit.    Allergies   Allergies as of 10/13/2022 - Review Complete 10/13/2022  Allergen Reaction Noted   Flagyl [metronidazole]  03/14/2013   Sulfa antibiotics  03/14/2013    Past Medical History   Past Medical History:  Diagnosis Date   Depression    Depression (emotion) 10/30/2015   GAD (generalized anxiety disorder)    Hot flashes 10/30/2015   IUD (intrauterine device) in place 04/16/2016   Lymph nodes enlarged 04/16/2016   Right under arm had CT and has seen Dr BJeanella Cara2/15/2017   PONV (postoperative nausea and vomiting)    Swollen lymph nodes    has had CT and saw Dr BRush Farmer  Past Surgical History   Past Surgical History:  Procedure Laterality Date   ABDOMINOPLASTY  2005   CESAREAN SECTION     x2   SHOULDER ARTHROSCOPY WITH OPEN ROTATOR CUFF REPAIR Right 07/08/2020   Procedure: SHOULDER ARTHROSCOPY WITH OPEN ROTATOR CUFF REPAIR;  Surgeon: Marybelle Killings, MD;  Location: [place];  Service: Orthopedics;  Laterality: Right;    Past Family History   Family History  Problem Relation Age of Onset   Cancer Mother        breast   Breast cancer Mother    Depression Mother    Hypertension Father    Cancer Maternal Grandmother        breast   Breast cancer Maternal Grandmother    Heart attack Maternal Grandfather     Other Paternal Grandfather        died in MVA   Colon cancer Neg Hx    Pancreatic cancer Neg Hx     Past Social History   Social History   Socioeconomic History   Marital status: Widowed    Spouse name: Not on file   Number of children: Not on file   Years of education: Not on file   Highest education level: Not on file  Occupational History   Not on file  Tobacco Use   Smoking status: Never   Smokeless tobacco: Never  Vaping Use   Vaping Use: Never used  Substance and Sexual Activity   Alcohol use: No   Drug use: No   Sexual activity: Yes    Birth control/protection: I.U.D.  Other Topics Concern   Not on file  Social History Narrative   Not on file   Social Determinants of Health   Financial Resource Strain: Low Risk  (01/20/2021)   Overall Financial Resource Strain (CARDIA)    Difficulty of Paying Living Expenses: Not hard at all  Food Insecurity: No Food Insecurity (06/15/2022)   Hunger Vital Sign    Worried About Running Out of Food in the Last Year: Never true    Ran Out of Food in the Last Year: Never true  Transportation Needs: No Transportation Needs (06/15/2022)   PRAPARE - Hydrologist (Medical): No    Lack of Transportation (Non-Medical): No  Physical Activity: Unknown (06/15/2022)   Exercise Vital Sign    Days of Exercise per Week: 5 days    Minutes of Exercise per Session: Not on file  Stress: Stress Concern Present (06/15/2022)   [place]    Feeling of Stress : To some extent  Social Connections: Moderately Isolated (06/15/2022)   Social Connection and Isolation Panel [NHANES]    Frequency of Communication with Friends and Family: More than three times a week    Frequency of Social Gatherings with Friends and Family: Twice a week    Attends Religious Services: 1 to 4 times per year    Active Member of [employer] or Organizations: No    Attends Theatre manager Meetings: Never    Marital Status: Widowed  Intimate Partner Violence: Not At Risk (06/15/2022)   Humiliation, Afraid, Rape, and Kick questionnaire    Fear of Current or Ex-Partner: No    Emotionally Abused: No    Physically Abused: No    Sexually Abused: No    Review of Systems   General: Negative for anorexia, weight loss, fever, chills, fatigue, weakness. Eyes: Negative for vision changes.  ENT: Negative for hoarseness, difficulty swallowing , nasal congestion. CV: Negative for chest pain, angina, palpitations, dyspnea on exertion, peripheral edema.  Respiratory: Negative for dyspnea at rest, dyspnea on exertion, cough, sputum, wheezing.  GI: See history of present illness. GU:  Negative for dysuria, hematuria, urinary incontinence, urinary frequency, nocturnal urination.  MS: Negative for joint pain, +low back pain.  Derm: Negative for rash or itching.  Neuro: Negative for weakness, abnormal sensation, seizure, frequent headaches, memory loss,  confusion.  Psych: Negative for anxiety, depression, suicidal ideation, hallucinations.  Endo: Negative for unusual weight change.  Heme: Negative for bruising or bleeding. Allergy: Negative for rash or hives.  Physical Exam   BP 115/70 (BP Location: Right Arm, Patient Position: Sitting, Cuff Size: Normal)   Pulse (!) 52   Temp 98.1 F (36.7 C) (Oral)   Ht '5\' 1"'$  (1.549 m)   Wt 157 lb (71.2 kg)   SpO2 97%   BMI 29.66 kg/m    General: Well-nourished, well-developed in no acute distress.  Head: Normocephalic, atraumatic.   Eyes: Conjunctiva pink, no icterus. Mouth: Oropharyngeal mucosa moist and pink   Neck: Supple without thyromegaly, masses, or lymphadenopathy.  Lungs: Clear to auscultation bilaterally.  Heart: Regular rate and rhythm, no murmurs rubs or gallops.  Abdomen: Bowel sounds are normal, nontender, nondistended, no hepatosplenomegaly or masses,  no abdominal bruits or hernia, no rebound or guarding.   Straight leg raise without tenderness Rectal: not performed Extremities: No lower extremity edema. No clubbing or deformities.  Neuro: Alert and oriented x 4 , grossly normal neurologically.  Skin: Warm and dry, no rash or jaundice.   Psych: Alert and cooperative, normal mood and affect.  Labs   None  Imaging Studies   MR ABDOMEN WWO CONTRAST  Result Date: 10/12/2022 CLINICAL DATA:  Left lower quadrant abdominal pain. EXAM: MRI ABDOMEN WITHOUT AND WITH CONTRAST TECHNIQUE: Multiplanar multisequence MR imaging of the abdomen was performed both before and after the administration of intravenous contrast. CONTRAST:  58m GADAVIST GADOBUTROL 1 MMOL/ML IV SOLN COMPARISON:  CT abdomen pelvis August 04, 2022. FINDINGS: Lower chest: No acute findings. Hepatobiliary: No significant hepatic steatosis. No suspicious hepatic lesion. Gallbladder is unremarkable. No biliary ductal dilation. Pancreas: No pancreatic ductal dilation or evidence of acute inflammation. Intrinsic T1 signal of the pancreatic parenchyma is within normal limits homogeneous enhancement of the pancreas postcontrast administration Small soft tissue nodule along the distal aspect of the pancreatic tail and inferior medial aspect of the spleen on image 6/7 measures 16 mm stable dating back to chest CT December 12, 2015 and following splenic signal on all pulse sequences compatible with a splenule. Spleen:  Normal size spleen. Adrenals/Urinary Tract: Bilateral adrenal glands appear normal. No hydronephrosis. Benign 2 cm right renal cyst on image 15/2 requires no imaging follow-up. Additional tiny bilateral fluid density renal lesions are technically too small to accurately characterize but statistically likely reflect benign cysts and requiring no independent imaging follow-up Stomach/Bowel: Large volume of formed stool throughout the colon. Vascular/Lymphatic: Normal caliber abdominal aorta. Smooth IVC contours. Portal, splenic and superior  mesenteric veins are patent. No pathologically enlarged abdominal lymph nodes. Other:  No significant abdominal free fluid. Musculoskeletal: No suspicious osseous lesion. IMPRESSION: 1. No acute findings in the abdomen. 2. Large volume of formed stool throughout the colon. Correlate with any clinical signs of constipation. Electronically Signed   By: JDahlia BailiffM.D.   On: 10/12/2022 16:05    Assessment   LLQ pain: extensive work up.  May be referred pain. Her abdominal exam is benign today. Two studies showing large volume stool, patient reports daily BMs and does not get relief of abdominal pain with increased stools. Gyn evaluation unremarkable.   Screening colonoscopy: first ever colonoscopy. Will use Linzess starting one week prior to procedure to augment bowel prep.    PLAN   Colonoscopy in near future. ASA 2.  I have discussed the risks, alternatives, benefits with regards to but not limited to the risk of reaction to medication, bleeding, infection, perforation and the patient is agreeable to proceed. Written consent to be obtained.    Laureen Ochs. Bobby Rumpf, [place], [facility]

## 2022-10-13 NOTE — Patient Instructions (Signed)
Colonoscopy to be scheduled.  Samples of Linzess 134mg provided for you to take daily to every other day starting one week before your colonoscopy.

## 2022-10-14 ENCOUNTER — Telehealth: Payer: Self-pay | Admitting: *Deleted

## 2022-10-14 NOTE — Telephone Encounter (Signed)
[facility]   TCS w/fastest provider Abbey Chatters or Rourk) ASA 2

## 2022-10-15 ENCOUNTER — Encounter: Payer: Self-pay | Admitting: *Deleted

## 2022-10-15 ENCOUNTER — Other Ambulatory Visit: Payer: Self-pay | Admitting: *Deleted

## 2022-10-15 DIAGNOSIS — Z6828 Body mass index (BMI) 28.0-28.9, adult: Secondary | ICD-10-CM | POA: Diagnosis not present

## 2022-10-15 DIAGNOSIS — R030 Elevated blood-pressure reading, without diagnosis of hypertension: Secondary | ICD-10-CM | POA: Diagnosis not present

## 2022-10-15 DIAGNOSIS — R21 Rash and other nonspecific skin eruption: Secondary | ICD-10-CM | POA: Diagnosis not present

## 2022-10-15 MED ORDER — PEG 3350-KCL-NA BICARB-NACL 420 G PO SOLR: 4000.0000 mL | Freq: Once | ORAL | 0 refills | Status: AC

## 2022-10-31 ENCOUNTER — Other Ambulatory Visit (HOSPITAL_COMMUNITY): Payer: Self-pay

## 2022-11-10 ENCOUNTER — Other Ambulatory Visit: Payer: Self-pay

## 2022-11-10 ENCOUNTER — Ambulatory Visit (HOSPITAL_COMMUNITY)
Admission: RE | Admit: 2022-11-10 | Discharge: 2022-11-10 | Disposition: A | Discharge status: Home / Self Care | Payer: Commercial Managed Care - PPO | Attending: Internal Medicine | Admitting: Internal Medicine

## 2022-11-10 ENCOUNTER — Ambulatory Visit (HOSPITAL_COMMUNITY): Payer: Commercial Managed Care - PPO | Admitting: Anesthesiology

## 2022-11-10 ENCOUNTER — Encounter (HOSPITAL_COMMUNITY)
Admission: RE | Disposition: A | Discharge status: Home / Self Care | Payer: Self-pay | Source: Home / Self Care | Attending: Internal Medicine

## 2022-11-10 ENCOUNTER — Encounter (HOSPITAL_COMMUNITY): Payer: Self-pay

## 2022-11-10 ENCOUNTER — Ambulatory Visit (HOSPITAL_BASED_OUTPATIENT_CLINIC_OR_DEPARTMENT_OTHER): Payer: Commercial Managed Care - PPO | Admitting: Anesthesiology

## 2022-11-10 DIAGNOSIS — Z1211 Encounter for screening for malignant neoplasm of colon: Secondary | ICD-10-CM | POA: Diagnosis not present

## 2022-11-10 DIAGNOSIS — K635 Polyp of colon: Secondary | ICD-10-CM | POA: Diagnosis not present

## 2022-11-10 DIAGNOSIS — D125 Benign neoplasm of sigmoid colon: Secondary | ICD-10-CM

## 2022-11-10 DIAGNOSIS — E039 Hypothyroidism, unspecified: Secondary | ICD-10-CM | POA: Insufficient documentation

## 2022-11-10 DIAGNOSIS — F419 Anxiety disorder, unspecified: Secondary | ICD-10-CM | POA: Insufficient documentation

## 2022-11-10 DIAGNOSIS — F32A Depression, unspecified: Secondary | ICD-10-CM | POA: Insufficient documentation

## 2022-11-10 HISTORY — PX: COLONOSCOPY WITH PROPOFOL: SHX5780

## 2022-11-10 HISTORY — PX: POLYPECTOMY: SHX149

## 2022-11-10 SURGERY — COLONOSCOPY WITH PROPOFOL
Anesthesia: General

## 2022-11-10 MED ORDER — PROPOFOL 500 MG/50ML IV EMUL
INTRAVENOUS | Status: DC | PRN
  Administered 2022-11-10: 175 ug/kg/min via INTRAVENOUS

## 2022-11-10 MED ORDER — LIDOCAINE 2% (20 MG/ML) 5 ML SYRINGE
INTRAMUSCULAR | Status: DC | PRN
  Administered 2022-11-10: 50 mg via INTRAVENOUS

## 2022-11-10 MED ORDER — PROPOFOL 10 MG/ML IV BOLUS
INTRAVENOUS | Status: DC | PRN
  Administered 2022-11-10: 50 mg via INTRAVENOUS

## 2022-11-10 MED ORDER — LACTATED RINGERS IV SOLN
INTRAVENOUS | Status: DC
  Administered 2022-11-10: 1000 mL via INTRAVENOUS

## 2022-11-10 NOTE — Anesthesia Postprocedure Evaluation (Signed)
Anesthesia Post Note  Patient: Meghan Murray  Procedure(s) Performed: COLONOSCOPY WITH PROPOFOL POLYPECTOMY INTESTINAL  Patient location during evaluation: Phase II Anesthesia Type: General Level of consciousness: awake and alert and oriented Pain management: pain level controlled Vital Signs Assessment: post-procedure vital signs reviewed and stable Respiratory status: spontaneous breathing, nonlabored ventilation and respiratory function stable Cardiovascular status: stable and blood pressure returned to baseline Postop Assessment: no apparent nausea or vomiting Anesthetic complications: no  No notable events documented.   Last Vitals:  Vitals:   11/10/22 0750 11/10/22 0951  BP: 110/66 (!) 92/53  Pulse: (!) 51 68  Resp: 14 (!) 21  Temp: 36.7 C 36.7 C  SpO2: 98% 100%    Last Pain:  Vitals:   11/10/22 0951  TempSrc: Axillary  PainSc: 0-No pain                 Avier Jech C Donnielle Addison

## 2022-11-10 NOTE — Discharge Instructions (Addendum)
  Colonoscopy Discharge Instructions  Read the instructions outlined below and refer to this sheet in the next few weeks. These discharge instructions provide you with general information on caring for yourself after you leave the hospital. Your doctor may also give you specific instructions. While your treatment has been planned according to the most current medical practices available, unavoidable complications occasionally occur.   ACTIVITY You may resume your regular activity, but move at a slower pace for the next 24 hours.  Take frequent rest periods for the next 24 hours.  Walking will help get rid of the air and reduce the bloated feeling in your belly (abdomen).  No driving for 24 hours (because of the medicine (anesthesia) used during the test).   Do not sign any important legal documents or operate any machinery for 24 hours (because of the anesthesia used during the test).  NUTRITION Drink plenty of fluids.  You may resume your normal diet as instructed by your doctor.  Begin with a light meal and progress to your normal diet. Heavy or fried foods are harder to digest and may make you feel sick to your stomach (nauseated).  Avoid alcoholic beverages for 24 hours or as instructed.  MEDICATIONS You may resume your normal medications unless your doctor tells you otherwise.  WHAT YOU CAN EXPECT TODAY Some feelings of bloating in the abdomen.  Passage of more gas than usual.  Spotting of blood in your stool or on the toilet paper.  IF YOU HAD POLYPS REMOVED DURING THE COLONOSCOPY: No aspirin products for 7 days or as instructed.  No alcohol for 7 days or as instructed.  Eat a soft diet for the next 24 hours.  FINDING OUT THE RESULTS OF YOUR TEST Not all test results are available during your visit. If your test results are not back during the visit, make an appointment with your caregiver to find out the results. Do not assume everything is normal if you have not heard from your  caregiver or the medical facility. It is important for you to follow up on all of your test results.  SEEK IMMEDIATE MEDICAL ATTENTION IF: You have more than a spotting of blood in your stool.  Your belly is swollen (abdominal distention).  You are nauseated or vomiting.  You have a temperature over 101.  You have abdominal pain or discomfort that is severe or gets worse throughout the day.   Your colonoscopy revealed 1 polyp(s) which I removed successfully. Await pathology results, my office will contact you. I recommend repeating colonoscopy in 7-10 years for surveillance purposes depending on pathology results.    I hope you have a great rest of your week!  Elon Alas. Abbey Chatters, D.O. Gastroenterology and Hepatology [facility]

## 2022-11-10 NOTE — Anesthesia Preprocedure Evaluation (Addendum)
Anesthesia Evaluation  Patient identified by MRN, date of birth, ID band Patient awake    Reviewed: Allergy & Precautions, H&P , NPO status , Patient's Chart, lab work & pertinent test results  History of Anesthesia Complications (+) PONV and history of anesthetic complications  Airway Mallampati: II  TM Distance: >3 FB Neck ROM: Full    Dental  (+) Dental Advisory Given, Teeth Intact   Pulmonary neg pulmonary ROS   Pulmonary exam normal breath sounds clear to auscultation       Cardiovascular negative cardio ROS Normal cardiovascular exam Rhythm:Regular Rate:Normal     Neuro/Psych  PSYCHIATRIC DISORDERS Anxiety Depression    negative neurological ROS  negative psych ROS   GI/Hepatic negative GI ROS, Neg liver ROS,,,  Endo/Other  Hypothyroidism (lower normal)    Renal/GU negative Renal ROS  negative genitourinary   Musculoskeletal  (+) Arthritis , Osteoarthritis,    Abdominal   Peds negative pediatric ROS (+)  Hematology negative hematology ROS (+)   Anesthesia Other Findings   Reproductive/Obstetrics negative OB ROS                             Anesthesia Physical Anesthesia Plan  ASA: 2  Anesthesia Plan: General   Post-op Pain Management: Minimal or no pain anticipated   Induction: Intravenous  PONV Risk Score and Plan: Propofol infusion  Airway Management Planned: Nasal Cannula and Natural Airway  Additional Equipment:   Intra-op Plan:   Post-operative Plan:   Informed Consent: I have reviewed the patients History and Physical, chart, labs and discussed the procedure including the risks, benefits and alternatives for the proposed anesthesia with the patient or authorized representative who has indicated his/her understanding and acceptance.     Dental advisory given  Plan Discussed with: CRNA and Surgeon  Anesthesia Plan Comments:         Anesthesia Quick  Evaluation

## 2022-11-10 NOTE — Transfer of Care (Signed)
Immediate Anesthesia Transfer of Care Note  Patient: Meghan Murray  Procedure(s) Performed: COLONOSCOPY WITH PROPOFOL POLYPECTOMY INTESTINAL  Patient Location: PACU  Anesthesia Type:General  Level of Consciousness: sedated  Airway & Oxygen Therapy: Patient Spontanous Breathing  Post-op Assessment: Report given to RN and Post -op Vital signs reviewed and stable  Post vital signs: Reviewed and stable  Last Vitals:  Vitals Value Taken Time  BP    Temp    Pulse    Resp    SpO2      Last Pain:  Vitals:   11/10/22 0925  TempSrc:   PainSc: 0-No pain      Patients Stated Pain Goal: 6 ([id number])  Complications: No notable events documented.

## 2022-11-10 NOTE — Op Note (Signed)
[facility] Patient Name: Meghan Murray Procedure Date: 11/10/2022 9:23 AM MRN: [id number] Date of Birth: [birth year] Attending MD: Elon Alas. Abbey Chatters , [place], [id number] CSN: [id number] Age: 52 Admit Type: Outpatient Procedure:                Colonoscopy Indications:              Screening for colorectal malignant neoplasm Providers:                Elon Alas. Abbey Chatters, DO, Crystal Page, Illene Labrador Referring MD:              Medicines:                See the Anesthesia note for documentation of the                            administered medications Complications:            No immediate complications. Estimated Blood Loss:     Estimated blood loss was minimal. Procedure:                Pre-Anesthesia Assessment:                           - The anesthesia plan was to use monitored                            anesthesia care (MAC).                           After obtaining informed consent, the colonoscope                            was passed under direct vision. Throughout the                            procedure, the patient's blood pressure, pulse, and                            oxygen saturations were monitored continuously. The                            [id number] scope was introduced through                            the anus and advanced to the the cecum, identified                            by appendiceal orifice and ileocecal valve. The                            colonoscopy was performed without difficulty. The                            patient tolerated the procedure well. The quality  of the bowel preparation was evaluated using the                            BBPS [facility] Bowel Preparation Scale) with scores                            of: Right Colon = 2 (minor amount of residual                            staining, small fragments of stool and/or opaque                            liquid, but mucosa  seen well), Transverse Colon = 3                            (entire mucosa seen well with no residual staining,                            small fragments of stool or opaque liquid) and Left                            Colon = 3 (entire mucosa seen well with no residual                            staining, small fragments of stool or opaque                            liquid). The total BBPS score equals 8. The quality                            of the bowel preparation was good. Scope In: 9:30:07 AM Scope Out: 9:47:23 AM Scope Withdrawal Time: 0 hours 13 minutes 8 seconds  Total Procedure Duration: 0 hours 17 minutes 16 seconds  Findings:      The perianal and digital rectal examinations were normal.      A 5 mm polyp was found in the sigmoid colon. The polyp was sessile. The       polyp was removed with a cold snare. Resection and retrieval were       complete.      The exam was otherwise without abnormality. Impression:               - One 5 mm polyp in the sigmoid colon, removed with                            a cold snare. Resected and retrieved.                           - The examination was otherwise normal. Moderate Sedation:      Per Anesthesia Care Recommendation:           - Patient has a contact number available for  emergencies. The signs and symptoms of potential                            delayed complications were discussed with the                            patient. Return to normal activities tomorrow.                            Written discharge instructions were provided to the                            patient.                           - Resume previous diet.                           - Continue present medications.                           - Await pathology results.                           - Repeat colonoscopy in 7-10 years for surveillance.                           - Return to GI clinic in 3 months. Procedure Code(s):        ---  Professional ---                           [phone], Colonoscopy, flexible; with removal of                            tumor(s), polyp(s), or other lesion(s) by snare                            technique Diagnosis Code(s):        --- Professional ---                           Z12.11, Encounter for screening for malignant                            neoplasm of colon                           D12.5, Benign neoplasm of sigmoid colon CPT copyright 2022 American Medical Association. All rights reserved. The codes documented in this report are preliminary and upon coder review may  be revised to meet current compliance requirements. Elon Alas. Abbey Chatters, DO [place] Abbey Chatters, DO 11/10/2022 9:49:27 AM This report has been signed electronically. Number of Addenda: 0

## 2022-11-10 NOTE — Interval H&P Note (Signed)
History and Physical Interval Note:  11/10/2022 9:18 AM  Meghan Murray  has presented today for surgery, with the diagnosis of SCREENING.  The various methods of treatment have been discussed with the patient and family. After consideration of risks, benefits and other options for treatment, the patient has consented to  Procedure(s) with comments: COLONOSCOPY WITH PROPOFOL (N/A) - 9:30 AM, pt knows to arrive at 7:30 as a surgical intervention.  The patient's history has been reviewed, patient examined, no change in status, stable for surgery.  I have reviewed the patient's chart and labs.  Questions were answered to the patient's satisfaction.     Eloise Harman

## 2022-11-11 LAB — SURGICAL PATHOLOGY

## 2022-11-16 ENCOUNTER — Encounter: Payer: Self-pay | Admitting: Gastroenterology

## 2022-11-17 ENCOUNTER — Encounter (HOSPITAL_COMMUNITY): Payer: Self-pay | Admitting: Internal Medicine

## 2022-11-17 ENCOUNTER — Other Ambulatory Visit: Payer: Self-pay | Admitting: Physician Assistant

## 2022-11-17 DIAGNOSIS — L309 Dermatitis, unspecified: Secondary | ICD-10-CM | POA: Diagnosis not present

## 2022-11-17 DIAGNOSIS — D229 Melanocytic nevi, unspecified: Secondary | ICD-10-CM | POA: Diagnosis not present

## 2022-11-17 DIAGNOSIS — D1801 Hemangioma of skin and subcutaneous tissue: Secondary | ICD-10-CM | POA: Diagnosis not present

## 2022-11-17 DIAGNOSIS — L578 Other skin changes due to chronic exposure to nonionizing radiation: Secondary | ICD-10-CM | POA: Diagnosis not present

## 2022-11-17 DIAGNOSIS — L814 Other melanin hyperpigmentation: Secondary | ICD-10-CM | POA: Diagnosis not present

## 2022-11-17 DIAGNOSIS — Z1231 Encounter for screening mammogram for malignant neoplasm of breast: Secondary | ICD-10-CM

## 2022-11-17 DIAGNOSIS — L710 Perioral dermatitis: Secondary | ICD-10-CM | POA: Diagnosis not present

## 2022-11-17 DIAGNOSIS — L821 Other seborrheic keratosis: Secondary | ICD-10-CM | POA: Diagnosis not present

## 2022-11-24 DIAGNOSIS — N951 Menopausal and female climacteric states: Secondary | ICD-10-CM | POA: Diagnosis not present

## 2022-11-24 DIAGNOSIS — E039 Hypothyroidism, unspecified: Secondary | ICD-10-CM | POA: Diagnosis not present

## 2022-12-01 ENCOUNTER — Other Ambulatory Visit (HOSPITAL_COMMUNITY): Payer: Self-pay

## 2022-12-01 DIAGNOSIS — Z6827 Body mass index (BMI) 27.0-27.9, adult: Secondary | ICD-10-CM | POA: Diagnosis not present

## 2022-12-01 DIAGNOSIS — N951 Menopausal and female climacteric states: Secondary | ICD-10-CM | POA: Diagnosis not present

## 2022-12-01 DIAGNOSIS — R5383 Other fatigue: Secondary | ICD-10-CM | POA: Diagnosis not present

## 2022-12-01 DIAGNOSIS — R232 Flushing: Secondary | ICD-10-CM | POA: Diagnosis not present

## 2022-12-31 ENCOUNTER — Ambulatory Visit
Admission: RE | Admit: 2022-12-31 | Discharge: 2022-12-31 | Disposition: A | Discharge status: Home / Self Care | Payer: Commercial Managed Care - PPO | Source: Ambulatory Visit | Attending: Physician Assistant | Admitting: Physician Assistant

## 2022-12-31 DIAGNOSIS — Z1231 Encounter for screening mammogram for malignant neoplasm of breast: Secondary | ICD-10-CM | POA: Diagnosis not present

## 2023-02-23 DIAGNOSIS — F329 Major depressive disorder, single episode, unspecified: Secondary | ICD-10-CM | POA: Diagnosis not present

## 2023-02-23 DIAGNOSIS — R030 Elevated blood-pressure reading, without diagnosis of hypertension: Secondary | ICD-10-CM | POA: Diagnosis not present

## 2023-02-23 DIAGNOSIS — Z6827 Body mass index (BMI) 27.0-27.9, adult: Secondary | ICD-10-CM | POA: Diagnosis not present

## 2023-03-16 DIAGNOSIS — E039 Hypothyroidism, unspecified: Secondary | ICD-10-CM | POA: Diagnosis not present

## 2023-03-16 DIAGNOSIS — N951 Menopausal and female climacteric states: Secondary | ICD-10-CM | POA: Diagnosis not present

## 2023-03-23 DIAGNOSIS — N951 Menopausal and female climacteric states: Secondary | ICD-10-CM | POA: Diagnosis not present

## 2023-03-23 DIAGNOSIS — Z6827 Body mass index (BMI) 27.0-27.9, adult: Secondary | ICD-10-CM | POA: Diagnosis not present

## 2023-03-23 DIAGNOSIS — R232 Flushing: Secondary | ICD-10-CM | POA: Diagnosis not present

## 2023-03-23 DIAGNOSIS — Z79890 Hormone replacement therapy (postmenopausal): Secondary | ICD-10-CM | POA: Diagnosis not present

## 2023-03-23 DIAGNOSIS — E039 Hypothyroidism, unspecified: Secondary | ICD-10-CM | POA: Diagnosis not present

## 2023-03-23 DIAGNOSIS — G479 Sleep disorder, unspecified: Secondary | ICD-10-CM | POA: Diagnosis not present

## 2023-03-23 DIAGNOSIS — R454 Irritability and anger: Secondary | ICD-10-CM | POA: Diagnosis not present

## 2023-04-26 DIAGNOSIS — Z0000 Encounter for general adult medical examination without abnormal findings: Secondary | ICD-10-CM | POA: Diagnosis not present

## 2023-04-26 DIAGNOSIS — R030 Elevated blood-pressure reading, without diagnosis of hypertension: Secondary | ICD-10-CM | POA: Diagnosis not present

## 2023-04-26 DIAGNOSIS — Z6827 Body mass index (BMI) 27.0-27.9, adult: Secondary | ICD-10-CM | POA: Diagnosis not present

## 2023-04-26 DIAGNOSIS — F329 Major depressive disorder, single episode, unspecified: Secondary | ICD-10-CM | POA: Diagnosis not present

## 2023-05-12 DIAGNOSIS — H5213 Myopia, bilateral: Secondary | ICD-10-CM | POA: Diagnosis not present

## 2023-05-25 ENCOUNTER — Other Ambulatory Visit (HOSPITAL_COMMUNITY): Payer: Self-pay

## 2023-06-01 DIAGNOSIS — L818 Other specified disorders of pigmentation: Secondary | ICD-10-CM | POA: Diagnosis not present

## 2023-06-01 DIAGNOSIS — D045 Carcinoma in situ of skin of trunk: Secondary | ICD-10-CM | POA: Diagnosis not present

## 2023-06-01 DIAGNOSIS — D485 Neoplasm of uncertain behavior of skin: Secondary | ICD-10-CM | POA: Diagnosis not present

## 2023-06-01 DIAGNOSIS — L309 Dermatitis, unspecified: Secondary | ICD-10-CM | POA: Diagnosis not present

## 2023-06-01 DIAGNOSIS — L710 Perioral dermatitis: Secondary | ICD-10-CM | POA: Diagnosis not present

## 2023-06-16 ENCOUNTER — Other Ambulatory Visit: Payer: Self-pay | Admitting: Obstetrics & Gynecology

## 2023-07-02 ENCOUNTER — Other Ambulatory Visit (HOSPITAL_COMMUNITY): Payer: Self-pay

## 2023-07-06 ENCOUNTER — Encounter: Payer: Self-pay | Admitting: Obstetrics & Gynecology

## 2023-07-06 ENCOUNTER — Ambulatory Visit (INDEPENDENT_AMBULATORY_CARE_PROVIDER_SITE_OTHER): Payer: Commercial Managed Care - PPO | Admitting: Obstetrics & Gynecology

## 2023-07-06 VITALS — BP 108/72 | HR 72 | Ht 61.0 in | Wt 152.5 lb

## 2023-07-06 DIAGNOSIS — Z01419 Encounter for gynecological examination (general) (routine) without abnormal findings: Secondary | ICD-10-CM | POA: Diagnosis not present

## 2023-07-06 NOTE — Progress Notes (Signed)
Subjective:     Meghan Murray is a 52 y.o. female here for a routine exam.  No LMP recorded (lmp unknown). (Menstrual status: IUD). [id number] Birth Control Method:  Mirena using as progesterone for the endometrium Menstrual Calendar(currently): amenorrheic  Current complaints: none.   Current acute medical issues:  none   Recent Gynecologic History No LMP recorded (lmp unknown). (Menstrual status: IUD). Last Pap: 2023,  normal Last mammogram: 2024,  normal  Past Medical History:  Diagnosis Date   Depression    Depression (emotion) 10/30/2015   GAD (generalized anxiety disorder)    Hot flashes 10/30/2015   IUD (intrauterine device) in place 04/16/2016   Lymph nodes enlarged 04/16/2016   Right under arm had CT and has seen Dr Burnett Sheng 10/30/2015   PONV (postoperative nausea and vomiting)    Swollen lymph nodes    has had CT and saw Dr Rayburn Ma    Past Surgical History:  Procedure Laterality Date   ABDOMINOPLASTY  2005   CESAREAN SECTION     x2   COLONOSCOPY WITH PROPOFOL N/A 11/10/2022   Procedure: COLONOSCOPY WITH PROPOFOL;  Surgeon: Lanelle Bal, DO;  Location: [facility];  Service: Endoscopy;  Laterality: N/A;  9:30 AM, pt knows to arrive at 7:30   POLYPECTOMY  11/10/2022   Procedure: POLYPECTOMY INTESTINAL;  Surgeon: Lanelle Bal, DO;  Location: [facility];  Service: Endoscopy;;   SHOULDER ARTHROSCOPY WITH OPEN ROTATOR CUFF REPAIR Right 07/08/2020   Procedure: SHOULDER ARTHROSCOPY WITH OPEN ROTATOR CUFF REPAIR;  Surgeon: Eldred Manges, MD;  Location: [facility];  Service: Orthopedics;  Laterality: Right;    OB History     Gravida  2   Para  2   Term  2   Preterm      AB      Living  2      SAB      IAB      Ectopic      Multiple      Live Births  2           Social History   Socioeconomic History   Marital status: Widowed    Spouse name: Not on file   Number of children: 2   Years of education: Not on  file   Highest education level: Not on file  Occupational History   Not on file  Tobacco Use   Smoking status: Never   Smokeless tobacco: Never  Vaping Use   Vaping status: Never Used  Substance and Sexual Activity   Alcohol use: No   Drug use: No   Sexual activity: Yes    Birth control/protection: I.U.D.  Other Topics Concern   Not on file  Social History Narrative   Not on file   Social Determinants of Health   Financial Resource Strain: Low Risk  (07/06/2023)   Overall Financial Resource Strain (CARDIA)    Difficulty of Paying Living Expenses: Not hard at all  Food Insecurity: No Food Insecurity (07/06/2023)   Hunger Vital Sign    Worried About Running Out of Food in the Last Year: Never true    Ran Out of Food in the Last Year: Never true  Transportation Needs: No Transportation Needs (07/06/2023)   PRAPARE - Administrator, Civil Service (Medical): No    Lack of Transportation (Non-Medical): No  Physical Activity: Sufficiently Active (07/06/2023)   Exercise Vital Sign    Days  of Exercise per Week: 5 days    Minutes of Exercise per Session: 60 min  Stress: No Stress Concern Present (07/06/2023)   [employer] of Occupational Health - Occupational Stress Questionnaire    Feeling of Stress : Only a little  Social Connections: Socially Isolated (07/06/2023)   Social Connection and Isolation Panel [NHANES]    Frequency of Communication with Friends and Family: More than three times a week    Frequency of Social Gatherings with Friends and Family: More than three times a week    Attends Religious Services: Never    Database administrator or Organizations: No    Attends Banker Meetings: Never    Marital Status: Widowed    Family History  Problem Relation Age of Onset   Cancer Mother        breast   Breast cancer Mother    Depression Mother    Hypertension Father    Cancer Maternal Grandmother        breast   Breast cancer Maternal  Grandmother    Heart attack Maternal Grandfather    Other Paternal Grandfather        died in MVA   Colon cancer Neg Hx    Pancreatic cancer Neg Hx      Current Outpatient Medications:    citalopram (CELEXA) 10 MG tablet, Take 10 mg by mouth daily., Disp: , Rfl:    levonorgestrel (MIRENA) 20 MCG/24HR IUD, 1 each by Intrauterine route once., Disp: , Rfl:    NON FORMULARY, Estrogen and testosterone pellets, Disp: , Rfl:    Omega-3 Fatty Acids (FISH OIL) 1000 MG CAPS, Take 2,000 mg by mouth daily., Disp: , Rfl:    phentermine 37.5 MG capsule, Take 37.5 mg by mouth every morning., Disp: , Rfl:    Vitamin D-Vitamin K (VITAMIN K2-VITAMIN D3 PO), Take 1 tablet by mouth daily., Disp: , Rfl:    estradiol (VIVELLE-DOT) 0.1 MG/24HR patch, Place 1 patch (0.1 mg total) onto the skin 2 (two) times a week. (Patient not taking: Reported on 07/06/2023), Disp: 8 patch, Rfl: 12   liothyronine (CYTOMEL) 5 MCG tablet, Take 15 mcg by mouth daily. (Patient not taking: Reported on 07/06/2023), Disp: , Rfl:    PROGESTERONE PO, Take 50 mg by mouth daily. Compounded Med (Patient not taking: Reported on 07/06/2023), Disp: , Rfl:   Review of Systems  Review of Systems  Constitutional: Negative for fever, chills, weight loss, malaise/fatigue and diaphoresis.  HENT: Negative for hearing loss, ear pain, nosebleeds, congestion, sore throat, neck pain, tinnitus and ear discharge.   Eyes: Negative for blurred vision, double vision, photophobia, pain, discharge and redness.  Respiratory: Negative for cough, hemoptysis, sputum production, shortness of breath, wheezing and stridor.   Cardiovascular: Negative for chest pain, palpitations, orthopnea, claudication, leg swelling and PND.  Gastrointestinal: negative for abdominal pain. Negative for heartburn, nausea, vomiting, diarrhea, constipation, blood in stool and melena.  Genitourinary: Negative for dysuria, urgency, frequency, hematuria and flank pain.  Musculoskeletal:  Negative for myalgias, back pain, joint pain and falls.  Skin: Negative for itching and rash.  Neurological: Negative for dizziness, tingling, tremors, sensory change, speech change, focal weakness, seizures, loss of consciousness, weakness and headaches.  Endo/Heme/Allergies: Negative for environmental allergies and polydipsia. Does not bruise/bleed easily.  Psychiatric/Behavioral: Negative for depression, suicidal ideas, hallucinations, memory loss and substance abuse. The patient is not nervous/anxious and does not have insomnia.        Objective:  Blood pressure 108/72,  pulse 72, height 5\' 1"  (1.549 m), weight 152 lb 8 oz (69.2 kg).   Physical Exam  Vitals reviewed. Constitutional: She is oriented to person, place, and time. She appears well-developed and well-nourished.  HENT:  Head: Normocephalic and atraumatic.        Right Ear: External ear normal.  Left Ear: External ear normal.  Nose: Nose normal.  Mouth/Throat: Oropharynx is clear and moist.  Eyes: Conjunctivae and EOM are normal. Pupils are equal, round, and reactive to light. Right eye exhibits no discharge. Left eye exhibits no discharge. No scleral icterus.  Neck: Normal range of motion. Neck supple. No tracheal deviation present. No thyromegaly present.  Cardiovascular: Normal rate, regular rhythm, normal heart sounds and intact distal pulses.  Exam reveals no gallop and no friction rub.   No murmur heard. Respiratory: Effort normal and breath sounds normal. No respiratory distress. She has no wheezes. She has no rales. She exhibits no tenderness.  GI: Soft. Bowel sounds are normal. She exhibits no distension and no mass. There is no tenderness. There is no rebound and no guarding.  Genitourinary:  Breasts no masses skin changes or nipple changes bilaterally      Vulva is normal without lesions Vagina is pink moist without discharge Cervix normal in appearance and pap is not done, strings visible, Mirena placed  11/15/2022 Uterus is normal size shape and contour Adnexa is negative with normal sized ovaries   Musculoskeletal: Normal range of motion. She exhibits no edema and no tenderness.  Neurological: She is alert and oriented to person, place, and time. She has normal reflexes. She displays normal reflexes. No cranial nerve deficit. She exhibits normal muscle tone. Coordination normal.  Skin: Skin is warm and dry. No rash noted. No erythema. No pallor.  Psychiatric: She has a normal mood and affect. Her behavior is normal. Judgment and thought content normal.       Medications Ordered at today's visit: No orders of the defined types were placed in this encounter.   Other orders placed at today's visit: No orders of the defined types were placed in this encounter.     Assessment:    Normal Gyn exam.    Plan:    Hormone replacement therapy: hormone replacement therapy: vivelle dot + Mirena. Follow up in: 2 years.     No follow-ups on file.

## 2023-07-14 DIAGNOSIS — T50905A Adverse effect of unspecified drugs, medicaments and biological substances, initial encounter: Secondary | ICD-10-CM | POA: Diagnosis not present

## 2023-07-14 DIAGNOSIS — D099 Carcinoma in situ, unspecified: Secondary | ICD-10-CM | POA: Diagnosis not present

## 2023-12-22 ENCOUNTER — Other Ambulatory Visit: Payer: Self-pay | Admitting: Obstetrics & Gynecology

## 2023-12-23 ENCOUNTER — Encounter: Payer: Self-pay | Admitting: Pharmacist

## 2023-12-23 ENCOUNTER — Other Ambulatory Visit (HOSPITAL_COMMUNITY): Payer: Self-pay

## 2023-12-23 ENCOUNTER — Other Ambulatory Visit: Payer: Self-pay

## 2023-12-23 MED ORDER — ESTRADIOL 0.1 MG/24HR TD PTTW
1.0000 | MEDICATED_PATCH | TRANSDERMAL | 12 refills | Status: AC
  Filled 2023-12-23 – 2024-01-07 (×2): qty 8, 28d supply, fill #0
  Filled 2024-05-22: qty 8, 28d supply, fill #1
  Filled 2024-08-16 – 2024-09-21 (×2): qty 8, 28d supply, fill #2
  Filled 2024-10-15: qty 24, 84d supply, fill #3

## 2023-12-27 ENCOUNTER — Other Ambulatory Visit (HOSPITAL_COMMUNITY): Payer: Self-pay

## 2024-01-07 ENCOUNTER — Other Ambulatory Visit (HOSPITAL_COMMUNITY): Payer: Self-pay

## 2024-01-31 DIAGNOSIS — Z111 Encounter for screening for respiratory tuberculosis: Secondary | ICD-10-CM | POA: Diagnosis not present

## 2024-01-31 DIAGNOSIS — Z23 Encounter for immunization: Secondary | ICD-10-CM | POA: Diagnosis not present

## 2024-02-01 ENCOUNTER — Other Ambulatory Visit: Payer: Self-pay | Admitting: Physician Assistant

## 2024-02-01 DIAGNOSIS — Z1231 Encounter for screening mammogram for malignant neoplasm of breast: Secondary | ICD-10-CM

## 2024-02-02 DIAGNOSIS — Z111 Encounter for screening for respiratory tuberculosis: Secondary | ICD-10-CM | POA: Diagnosis not present

## 2024-02-08 DIAGNOSIS — Z111 Encounter for screening for respiratory tuberculosis: Secondary | ICD-10-CM | POA: Diagnosis not present

## 2024-02-24 ENCOUNTER — Ambulatory Visit
Admission: RE | Admit: 2024-02-24 | Discharge: 2024-02-24 | Disposition: A | Discharge status: Home / Self Care | Source: Ambulatory Visit | Attending: Physician Assistant | Admitting: Physician Assistant

## 2024-02-24 DIAGNOSIS — Z1231 Encounter for screening mammogram for malignant neoplasm of breast: Secondary | ICD-10-CM

## 2024-05-31 ENCOUNTER — Other Ambulatory Visit (HOSPITAL_COMMUNITY): Payer: Self-pay

## 2024-05-31 ENCOUNTER — Other Ambulatory Visit (HOSPITAL_BASED_OUTPATIENT_CLINIC_OR_DEPARTMENT_OTHER): Payer: Self-pay

## 2024-05-31 MED ORDER — SPIRONOLACTONE 25 MG PO TABS
25.0000 mg | ORAL_TABLET | Freq: Two times a day (BID) | ORAL | 1 refills | Status: DC
  Filled 2024-05-31: qty 60, 30d supply, fill #0
  Filled 2024-06-26: qty 60, 30d supply, fill #1

## 2024-06-26 ENCOUNTER — Other Ambulatory Visit (HOSPITAL_COMMUNITY): Payer: Self-pay

## 2024-08-16 ENCOUNTER — Other Ambulatory Visit (HOSPITAL_COMMUNITY): Payer: Self-pay

## 2024-08-22 ENCOUNTER — Other Ambulatory Visit (HOSPITAL_COMMUNITY): Payer: Self-pay

## 2024-08-23 ENCOUNTER — Other Ambulatory Visit (HOSPITAL_COMMUNITY): Payer: Self-pay

## 2024-08-23 ENCOUNTER — Other Ambulatory Visit: Payer: Self-pay

## 2024-09-02 ENCOUNTER — Other Ambulatory Visit (HOSPITAL_COMMUNITY): Payer: Self-pay

## 2024-10-18 ENCOUNTER — Other Ambulatory Visit (HOSPITAL_COMMUNITY): Payer: Self-pay

## 2024-10-18 MED ORDER — SPIRONOLACTONE 25 MG PO TABS
25.0000 mg | ORAL_TABLET | Freq: Two times a day (BID) | ORAL | 1 refills | Status: AC
  Filled 2024-10-18: qty 180, 90d supply, fill #0

## 2024-10-19 ENCOUNTER — Other Ambulatory Visit (HOSPITAL_COMMUNITY): Payer: Self-pay

## 2024-10-19 ENCOUNTER — Other Ambulatory Visit: Payer: Self-pay

## 2024-10-20 ENCOUNTER — Other Ambulatory Visit: Payer: Self-pay

## 2024-10-20 ENCOUNTER — Other Ambulatory Visit (HOSPITAL_COMMUNITY): Payer: Self-pay
